# Patient Record
Sex: Female | Born: 1990 | Race: Black or African American | Hispanic: No | Smoking: Current every day smoker
Health system: Southern US, Community
[De-identification: ages and names within clinical notes are randomized; demographics above are authoritative.]

## PROBLEM LIST (undated history)

## (undated) ENCOUNTER — Inpatient Hospital Stay (HOSPITAL_COMMUNITY): Payer: Self-pay

## (undated) DIAGNOSIS — Z789 Other specified health status: Secondary | ICD-10-CM

## (undated) HISTORY — PX: TONSILLECTOMY AND ADENOIDECTOMY: SUR1326

## (undated) HISTORY — PX: VULVA SURGERY: SHX837

---

## 2015-11-24 ENCOUNTER — Ambulatory Visit (HOSPITAL_COMMUNITY)
Admission: EM | Admit: 2015-11-24 | Discharge: 2015-11-24 | Disposition: A | Discharge status: Home / Self Care | Payer: Medicaid Other | Attending: Physician Assistant | Admitting: Physician Assistant

## 2015-11-24 ENCOUNTER — Encounter (HOSPITAL_COMMUNITY): Payer: Self-pay | Admitting: Emergency Medicine

## 2015-11-24 DIAGNOSIS — R300 Dysuria: Secondary | ICD-10-CM | POA: Diagnosis not present

## 2015-11-24 DIAGNOSIS — R599 Enlarged lymph nodes, unspecified: Secondary | ICD-10-CM

## 2015-11-24 LAB — POCT URINALYSIS DIP (DEVICE)
Bilirubin Urine: NEGATIVE
GLUCOSE, UA: NEGATIVE mg/dL
KETONES UR: NEGATIVE mg/dL
Leukocytes, UA: NEGATIVE
Nitrite: NEGATIVE
PROTEIN: 30 mg/dL — AB
UROBILINOGEN UA: 0.2 mg/dL (ref 0.0–1.0)
pH: 6 (ref 5.0–8.0)

## 2015-11-24 MED ORDER — CEPHALEXIN 500 MG PO CAPS: 500.0000 mg | ORAL_CAPSULE | Freq: Four times a day (QID) | ORAL | 0 refills | Status: DC

## 2015-11-24 NOTE — ED Triage Notes (Signed)
The patient presented to the [facility] with a complaint of a possible swollen lymph node on the right side of neck. The patient also complained of dysuria and urinary frequency x 2 weeks.

## 2015-11-24 NOTE — ED Provider Notes (Signed)
CSN: [id number]     Arrival date & time 11/24/15  1619 History   First MD Initiated Contact with Patient 11/24/15 1653     Chief Complaint  Patient presents with  . Dysuria  . Lymphadenopathy   (Consider location/radiation/quality/duration/timing/severity/associated sxs/prior Treatment) HPI  25 year old female states that she has had dysuria for about 2 weeks. He denies any other symptoms in her vaginal area. She also complains of a single node right neck. There is been no treatment for either complaint. Patient states that she essentially works about 7 days a week and does not have time to come to urgent care. History reviewed. No pertinent past medical history. History reviewed. No pertinent surgical history. History reviewed. No pertinent family history. Social History  Substance Use Topics  . Smoking status: Heavy Tobacco Smoker    Packs/day: 0.50    Types: Cigarettes  . Smokeless tobacco: Never Used  . Alcohol use No   OB History    No data available     Review of Systems  Denies: HEADACHE, NAUSEA, ABDOMINAL PAIN, CHEST PAIN, CONGESTION, DYSURIA, SHORTNESS OF BREATH  Allergies  Review of patient's allergies indicates no known allergies.  Home Medications   Prior to Admission medications   Medication Sig Start Date End Date Taking? Authorizing Provider  cephALEXin (KEFLEX) 500 MG capsule Take 1 capsule (500 mg total) by mouth 4 (four) times daily. 11/24/15   Tharon Aquas, PA   Meds Ordered and Administered this Visit  Medications - No data to display  BP 130/76 (BP Location: Left Arm)   Pulse 70   Temp 98.3 F (36.8 C) (Oral)   Resp 16   LMP 11/24/2015 (Exact Date)   SpO2 100%  No data found.   Physical Exam NURSES NOTES AND VITAL SIGNS REVIEWED. CONSTITUTIONAL: Well developed, well nourished, no acute distress HEENT: normocephalic, atraumatic EYES: Conjunctiva normal NECK:normal ROM, supple, no adenopathy PULMONARY:No respiratory distress, normal  effort ABDOMINAL: Soft, ND, NT BS+, No CVAT MUSCULOSKELETAL: Normal ROM of all extremities,  SKIN: warm and dry without rash PSYCHIATRIC: Mood and affect, behavior are normal NURSES NOTES AND VITAL SIGNS REVIEWED. CONSTITUTIONAL: Well developed, well nourished, no acute distress HEENT: normocephalic, atraumatic, right and left TM's are normal EYES: Conjunctiva normal NECK:normal ROM, supple, no adenopathy PULMONARY:No respiratory distress, normal effort    Urgent Care Course   Clinical Course    Procedures (including critical care time)  Labs Review Labs Reviewed  POCT URINALYSIS DIP (DEVICE) - Abnormal; Notable for the following:       Result Value   Hgb urine dipstick LARGE (*)    Protein, ur 30 (*)    All other components within normal limits  Urine culture is pending at this time.  Imaging Review No results found.   Visual Acuity Review  Right Eye Distance:   Left Eye Distance:   Bilateral Distance:    Right Eye Near:   Left Eye Near:    Bilateral Near:      I have advised patient that her urinalysis is not positive for UTI so I would like to wait until urine culture returns. We'll treat left no swelling with Keflex and heat. Patient is advised to follow-up. Information for gynecology is provided to the patient.   Rx forKeflex MDM   1. Palpable lymph node   2. Dysuria     Patient is reassured that there are no issues that require transfer to higher level of care at this time or additional tests. Patient  is advised to continue home symptomatic treatment. Patient is advised that if there are new or worsening symptoms to attend the emergency department, contact primary care provider, or return to UC. Instructions of care provided discharged home in stable condition.    THIS NOTE WAS GENERATED USING A VOICE RECOGNITION SOFTWARE PROGRAM. ALL REASONABLE EFFORTS  WERE MADE TO PROOFREAD THIS DOCUMENT FOR ACCURACY.  I have verbally reviewed the discharge  instructions with the patient. A printed AVS was given to the patient.  All questions were answered prior to discharge.      Tharon AquasFrank C Deveon Kisiel, PA 11/24/15 2021    Tharon AquasFrank C Christyanna Mckeon, [place]PA 11/24/15 2023

## 2015-11-24 NOTE — Discharge Instructions (Signed)
Contact GYN for the issue of your prolonged period.

## 2015-11-26 ENCOUNTER — Telehealth (HOSPITAL_COMMUNITY): Payer: Self-pay | Admitting: Emergency Medicine

## 2015-11-26 NOTE — Telephone Encounter (Signed)
Patient called asking about pregnancy test result.  Verified identity.  Notified the only test run was urine dip for infection.  No further comment from patient

## 2015-12-07 ENCOUNTER — Encounter: Payer: Self-pay | Admitting: Obstetrics and Gynecology

## 2015-12-07 ENCOUNTER — Ambulatory Visit (INDEPENDENT_AMBULATORY_CARE_PROVIDER_SITE_OTHER): Payer: Medicaid Other | Admitting: Obstetrics and Gynecology

## 2015-12-07 DIAGNOSIS — N898 Other specified noninflammatory disorders of vagina: Secondary | ICD-10-CM | POA: Diagnosis not present

## 2015-12-07 DIAGNOSIS — Z3202 Encounter for pregnancy test, result negative: Secondary | ICD-10-CM | POA: Diagnosis not present

## 2015-12-07 DIAGNOSIS — O039 Complete or unspecified spontaneous abortion without complication: Secondary | ICD-10-CM

## 2015-12-07 DIAGNOSIS — Z202 Contact with and (suspected) exposure to infections with a predominantly sexual mode of transmission: Secondary | ICD-10-CM | POA: Diagnosis not present

## 2015-12-07 DIAGNOSIS — Z30011 Encounter for initial prescription of contraceptive pills: Secondary | ICD-10-CM

## 2015-12-07 LAB — POCT URINE PREGNANCY: Preg Test, Ur: NEGATIVE

## 2015-12-07 MED ORDER — DESOGESTREL-ETHINYL ESTRADIOL 0.15-30 MG-MCG PO TABS: 1.0000 | ORAL_TABLET | Freq: Every day | ORAL | 11 refills | Status: DC

## 2015-12-07 NOTE — Progress Notes (Signed)
Subjective:     Patient ID: Ralph DowdyKani Shutter, female   DOB: 07/13/1990, 25 y.o.   MRN: [id number]  HPI: Ms Perlie GoldRussell presents with several complaints. She has had irregular cycles since the birth of her last child. Has used several birth control options but has not liked for various reasons. Last being Nexplanon, had removed due to bleeding. She is sexual active but does not currently have a partner.  Had a faint postive Home UPT about a week ago. She had been bleeding for 3 weeks. Bleeding has now stopped but she has a tannish vaginal discharge. Denies any bowel or bladder dysfunction.    Review of Systems  Constitutional: Negative.   Respiratory: Negative.   Cardiovascular: Negative.   Gastrointestinal: Negative.   Genitourinary: Positive for vaginal discharge. Negative for menstrual problem, pelvic pain, urgency, vaginal bleeding and vaginal pain.       Objective:   Physical Exam  Constitutional: She appears well-developed and well-nourished.  Cardiovascular: Normal rate and regular rhythm.   Pulmonary/Chest: Effort normal and breath sounds normal.  Genitourinary:  Genitourinary Comments: Nl EGBUS, scant white/yellow discharge noted, uterus small mobile, non tender, no adnexal masses or tenderness       Assessment:    SAB   Vaginal discharge    Contraception management    Plan:     UPT in office today negative. Pt informed. Discussed contraceptive options. R/B of each reviewed. Following discussion, pt desires to try OCP's. Rx sent to pharmacy. To start today. Advised to use back up method. NuSwab obtained today for d/c and ST testing

## 2015-12-07 NOTE — Patient Instructions (Signed)
Oral Contraception Use Oral contraceptive pills (OCPs) are medicines taken to prevent pregnancy. OCPs work by preventing the ovaries from releasing eggs. The hormones in OCPs also cause the cervical mucus to thicken, preventing the sperm from entering the uterus. The hormones also cause the uterine lining to become thin, not allowing a fertilized egg to attach to the inside of the uterus. OCPs are highly effective when taken exactly as prescribed. However, OCPs do not prevent sexually transmitted diseases (STDs). Safe sex practices, such as using condoms along with an OCP, can help prevent STDs. Before taking OCPs, you may have a physical exam and Pap test. Your health care provider may also order blood tests if necessary. Your health care provider will make sure you are a good candidate for oral contraception. Discuss with your health care provider the possible side effects of the OCP you may be prescribed. When starting an OCP, it can take 2 to 3 months for the body to adjust to the changes in hormone levels in your body.  HOW TO TAKE ORAL CONTRACEPTIVE PILLS Your health care provider may advise you on how to start taking the first cycle of OCPs. Otherwise, you can:   Start on day 1 of your menstrual period. You will not need any backup contraceptive protection with this start time.   Start on the first Sunday after your menstrual period or the day you get your prescription. In these cases, you will need to use backup contraceptive protection for the first week.   Start the pill at any time of your cycle. If you take the pill within 5 days of the start of your period, you are protected against pregnancy right away. In this case, you will not need a backup form of birth control. If you start at any other time of your menstrual cycle, you will need to use another form of birth control for 7 days. If your OCP is the type called a minipill, it will protect you from pregnancy after taking it for 2 days (48  hours). After you have started taking OCPs:   If you forget to take 1 pill, take it as soon as you remember. Take the next pill at the regular time.   If you miss 2 or more pills, call your health care provider because different pills have different instructions for missed doses. Use backup birth control until your next menstrual period starts.   If you use a 28-day pack that contains inactive pills and you miss 1 of the last 7 pills (pills with no hormones), it will not matter. Throw away the rest of the non-hormone pills and start a new pill pack.  No matter which day you start the OCP, you will always start a new pack on that same day of the week. Have an extra pack of OCPs and a backup contraceptive method available in case you miss some pills or lose your OCP pack.  HOME CARE INSTRUCTIONS   Do not smoke.   Always use a condom to protect against STDs. OCPs do not protect against STDs.   Use a calendar to mark your menstrual period days.   Read the information and directions that came with your OCP. Talk to your health care provider if you have questions.  SEEK MEDICAL CARE IF:   You develop nausea and vomiting.   You have abnormal vaginal discharge or bleeding.   You develop a rash.   You miss your menstrual period.   You are losing   your hair.   You need treatment for mood swings or depression.   You get dizzy when taking the OCP.   You develop acne from taking the OCP.   You become pregnant.  SEEK IMMEDIATE MEDICAL CARE IF:   You develop chest pain.   You develop shortness of breath.   You have an uncontrolled or severe headache.   You develop numbness or slurred speech.   You develop visual problems.   You develop pain, redness, and swelling in the legs.    This information is not intended to replace advice given to you by your health care provider. Make sure you discuss any questions you have with your health care provider.   Document  Released: 03/24/2011 Document Revised: 04/25/2014 Document Reviewed: 09/23/2012 Elsevier Interactive Patient Education 2016 Elsevier Inc.  

## 2015-12-10 ENCOUNTER — Telehealth: Payer: Self-pay | Admitting: *Deleted

## 2015-12-10 NOTE — Telephone Encounter (Signed)
Patient made aware of lab results and has a c/o vaginal discharge with itching and a foul odor Flagyl 500 mo po twice a day x 7 days and Diflucan 150 mg po x 1 [facility] [phone].

## 2015-12-14 ENCOUNTER — Telehealth: Payer: Self-pay | Admitting: *Deleted

## 2015-12-14 ENCOUNTER — Encounter: Payer: Self-pay | Admitting: *Deleted

## 2015-12-14 DIAGNOSIS — A749 Chlamydial infection, unspecified: Secondary | ICD-10-CM

## 2015-12-14 DIAGNOSIS — N760 Acute vaginitis: Principal | ICD-10-CM

## 2015-12-14 DIAGNOSIS — B9689 Other specified bacterial agents as the cause of diseases classified elsewhere: Secondary | ICD-10-CM

## 2015-12-14 LAB — NUSWAB VG+, CANDIDA 6SP
Atopobium vaginae: HIGH Score — AB
BVAB 2: HIGH Score — AB
CANDIDA PARAPSILOSIS, NAA: NEGATIVE
CANDIDA TROPICALIS, NAA: NEGATIVE
Candida albicans, NAA: NEGATIVE
Candida glabrata, NAA: NEGATIVE
Candida krusei, NAA: NEGATIVE
Candida lusitaniae, NAA: NEGATIVE
Chlamydia trachomatis, NAA: POSITIVE — AB
MEGASPHAERA 1: HIGH {score} — AB
Neisseria gonorrhoeae, NAA: NEGATIVE
TRICH VAG BY NAA: NEGATIVE

## 2015-12-14 MED ORDER — METRONIDAZOLE 0.75 % VA GEL: 1.0000 | Freq: Every day | VAGINAL | 0 refills | Status: AC

## 2015-12-14 MED ORDER — AZITHROMYCIN 250 MG PO TABS
250.0000 mg | ORAL_TABLET | Freq: Once | ORAL | 0 refills | Status: AC
Start: 2015-12-14 — End: 2015-12-14

## 2015-12-14 NOTE — Telephone Encounter (Signed)
Per Dr Alysia PennaErvin, I ordered 1 gm azithromycin for her +chlamydia, and Metrogel one applicator qhs x7 days, sent to pharmacy of record. Attempted to call patient with test results and prescription info. The call went to voice mail but was unable to leave a message as the mailbox was full. Will send letter and STD card.

## 2015-12-14 NOTE — Telephone Encounter (Signed)
-----   Message from Hermina StaggersMichael L Ervin, MD sent at 12/14/2015  8:24 AM EDT ----- Please call pt and let her know about + Chlamydia. Treat with 1 gm of Azithromycin. Advise to have partner tested and treated as well Reframe from IC until Tx competed. Also Metrogel 1 applicator qhs x 7 days Thanks  Casimiro NeedleMichael ----- Message ----- From: Arne ClevelandMandy J Hutchinson, CMA Sent: 12/07/2015   9:24 AM To: Hermina StaggersMichael L Ervin, MD

## 2016-05-27 ENCOUNTER — Emergency Department (HOSPITAL_COMMUNITY)
Admission: EM | Admit: 2016-05-27 | Discharge: 2016-05-27 | Disposition: A | Discharge status: Home / Self Care | Payer: Medicaid Other | Attending: Emergency Medicine | Admitting: Emergency Medicine

## 2016-05-27 ENCOUNTER — Encounter (HOSPITAL_COMMUNITY): Payer: Self-pay | Admitting: *Deleted

## 2016-05-27 DIAGNOSIS — R0981 Nasal congestion: Secondary | ICD-10-CM | POA: Diagnosis present

## 2016-05-27 DIAGNOSIS — F17210 Nicotine dependence, cigarettes, uncomplicated: Secondary | ICD-10-CM | POA: Insufficient documentation

## 2016-05-27 DIAGNOSIS — J0100 Acute maxillary sinusitis, unspecified: Secondary | ICD-10-CM | POA: Insufficient documentation

## 2016-05-27 LAB — PREGNANCY, URINE: PREG TEST UR: NEGATIVE

## 2016-05-27 MED ORDER — GUAIFENESIN 100 MG/5ML PO LIQD: 100.0000 mg | ORAL | 0 refills | Status: DC | PRN

## 2016-05-27 MED ORDER — FLUCONAZOLE 150 MG PO TABS: 150.0000 mg | ORAL_TABLET | Freq: Once | ORAL | 0 refills | Status: AC

## 2016-05-27 MED ORDER — AMOXICILLIN-POT CLAVULANATE 875-125 MG PO TABS: 1.0000 | ORAL_TABLET | Freq: Two times a day (BID) | ORAL | 0 refills | Status: DC

## 2016-05-27 NOTE — ED Provider Notes (Signed)
[facility] Provider Note   CSN: [id number] Arrival date & time: 05/27/16  1100  By signing my name below, I, Teofilo PodMatthew P. Jamison, attest that this documentation has been prepared under the direction and in the presence of Fayrene HelperBowie Keirstan Iannello, PA-C. Electronically Signed: Teofilo PodMatthew P. Jamison, ED Scribe. 05/27/2016. 2:20 PM.    History   Chief Complaint Chief Complaint  Patient presents with  . Nasal Congestion  . Cough   The history is provided by the patient. No language interpreter was used.   HPI Comments:  Jenna Mcgrath is a 26 y.o. female who presents to the Emergency Department complaining of persistent productive cough x 2 weeks. Pt reports yellow and green sputum with her cough. Pt complains of associated rhinorrhea (green and yellow), congestion, generalized body aches, enlarged lymph nodes on the right side, neck pain. No known allergy to medication, but notes that she typically gets yeast infections with antibiotics. Pt reports possible pregnancy, LNMP was 2 weeks ago. Pt has taken Theraflu, Nyquil. Pt denies sore throat, nausea, vomiting, diarrhea, SOB  History reviewed. No pertinent past medical history.  Patient Active Problem List   Diagnosis Date Noted  . Spontaneous abortion 12/07/2015  . Vaginal discharge 12/07/2015  . Encounter for initial prescription of contraceptive pills 12/07/2015  . Exposure to STD 12/07/2015    Past Surgical History:  Procedure Laterality Date  . TONSILLECTOMY AND ADENOIDECTOMY    . VULVA SURGERY      OB History    Gravida Para Term Preterm AB Living   3 1 1   2 1    SAB TAB Ectopic Multiple Live Births   1 1     1        Home Medications    Prior to Admission medications   Medication Sig Start Date End Date Taking? Authorizing Provider  desogestrel-ethinyl estradiol (APRI) 0.15-30 MG-MCG tablet Take 1 tablet by mouth daily. 12/07/15   Hermina StaggersMichael L Ervin, MD    Family History Family History  Problem Relation Age of Onset  .  Cancer Maternal Grandmother   . Diabetes Maternal Grandmother   . Diabetes Paternal Grandmother   . Colon cancer Paternal Grandfather     Social History Social History  Substance Use Topics  . Smoking status: Heavy Tobacco Smoker    Packs/day: 0.50    Types: Cigarettes  . Smokeless tobacco: Never Used  . Alcohol use No     Comment: Socially     Allergies   Patient has no known allergies.   Review of Systems Review of Systems  HENT: Positive for congestion and rhinorrhea. Negative for sore throat.   Respiratory: Positive for cough. Negative for shortness of breath.   Gastrointestinal: Negative for diarrhea, nausea and vomiting.  Musculoskeletal: Positive for myalgias and neck pain.     Physical Exam Updated Vital Signs BP 137/87 (BP Location: Right Arm)   Pulse 95   Temp 98.4 F (36.9 C) (Oral)   Resp 16   LMP 05/13/2016   SpO2 100%   Physical Exam  Constitutional: She appears well-developed and well-nourished. No distress.  HENT:  Head: Normocephalic and atraumatic.  Right Ear: External ear normal.  Left Ear: External ear normal.  Nose: Nose normal.  Mouth/Throat: Oropharynx is clear and moist.  Tenderness to frontal and maxillary sinus on percussion. No nuchal rigidity.   Eyes: Conjunctivae are normal.  Cardiovascular: Normal rate and normal heart sounds.  Exam reveals no gallop and no friction rub.   No murmur heard. Mild  tachycardia  Pulmonary/Chest: Effort normal and breath sounds normal. She has no rales.  Abdominal: She exhibits no distension.  Neurological: She is alert.  Skin: Skin is warm and dry.  Psychiatric: She has a normal mood and affect.  Nursing note and vitals reviewed.    ED Treatments / Results  DIAGNOSTIC STUDIES:  Oxygen Saturation is 100% on RA, normal by my interpretation.    COORDINATION OF CARE:  2:20 PM Will order antibiotics. Discussed treatment plan with pt at bedside and pt agreed to plan.   Labs (all labs ordered  are listed, but only abnormal results are displayed) Labs Reviewed - No data to display  EKG  EKG Interpretation None       Radiology No results found.  Procedures Procedures (including critical care time)  Medications Ordered in ED Medications - No data to display   Initial Impression / Assessment and Plan / ED Course  Pt symptoms suggestive of sinusitis (persistent greenish nasal discharge >10 days) Pt will be discharged with augmentin.  She also request medication for potential yeast infection from abx use.  Her pregnancy test is negative, diflucan prescribed.  Discussed return precautions.  Pt is hemodynamically stable & in NAD prior to discharge.  I have reviewed the triage vital signs and the nursing notes.  Pertinent labs & imaging results that were available during my care of the patient were reviewed by me and considered in my medical decision making (see chart for details).     BP 137/87 (BP Location: Right Arm)   Pulse 95   Temp 98.4 F (36.9 C) (Oral)   Resp 16   LMP 05/13/2016   SpO2 100%    Final Clinical Impressions(s) / ED Diagnoses   Final diagnoses:  Acute maxillary sinusitis, recurrence not specified    New Prescriptions New Prescriptions   AMOXICILLIN-CLAVULANATE (AUGMENTIN) 875-125 MG TABLET    Take 1 tablet by mouth 2 (two) times daily. One po bid x 7 days   FLUCONAZOLE (DIFLUCAN) 150 MG TABLET    Take 1 tablet (150 mg total) by mouth once.   GUAIFENESIN (ROBITUSSIN) 100 MG/5ML LIQUID    Take 5-10 mLs (100-200 mg total) by mouth every 4 (four) hours as needed for cough.   I personally performed the services described in this documentation, which was scribed in my presence. The recorded information has been reviewed and is accurate.       Fayrene Helper, PA-C 05/27/16 1558    Raeford Razor, MD 06/06/16 [phone]

## 2016-05-27 NOTE — ED Triage Notes (Signed)
Pt c/o productive cough & nasal drainage with green & brown nasal drainage & sputum, pt generalized body aches, onset x 2 wks, pt denies n/v/d, A&O x4

## 2016-05-27 NOTE — ED Notes (Signed)
Pt states she understands instructions. Home stable with steady gait and family. 

## 2016-05-27 NOTE — ED Notes (Signed)
Pt oob to BR with steady gait. 

## 2016-05-27 NOTE — ED Notes (Signed)
Pt c/o yellow-green nasal discharge.

## 2016-07-05 ENCOUNTER — Telehealth: Payer: Self-pay | Admitting: *Deleted

## 2016-07-05 DIAGNOSIS — B379 Candidiasis, unspecified: Secondary | ICD-10-CM

## 2016-07-05 DIAGNOSIS — T3695XA Adverse effect of unspecified systemic antibiotic, initial encounter: Secondary | ICD-10-CM

## 2016-07-05 DIAGNOSIS — N3000 Acute cystitis without hematuria: Secondary | ICD-10-CM

## 2016-07-05 MED ORDER — FLUCONAZOLE 150 MG PO TABS
150.0000 mg | ORAL_TABLET | Freq: Once | ORAL | 0 refills | Status: AC
Start: 2016-07-05 — End: 2016-07-05

## 2016-07-05 MED ORDER — SULFAMETHOXAZOLE-TRIMETHOPRIM 800-160 MG PO TABS: 1.0000 | ORAL_TABLET | Freq: Two times a day (BID) | ORAL | 0 refills | Status: DC

## 2016-07-05 MED ORDER — PHENAZOPYRIDINE HCL 200 MG PO TABS: 200.0000 mg | ORAL_TABLET | Freq: Three times a day (TID) | ORAL | 0 refills | Status: AC | PRN

## 2016-07-05 NOTE — Telephone Encounter (Signed)
Patient is calling with a bladder infection- she is having burning with urination. Protocol medication sent to her pharmacy. Patient request yeast treatment for after- Diflucan sent for that. Patient instructed to call for appointment if treatment does not work.

## 2016-07-26 ENCOUNTER — Other Ambulatory Visit: Payer: Self-pay | Admitting: *Deleted

## 2016-07-26 DIAGNOSIS — B9689 Other specified bacterial agents as the cause of diseases classified elsewhere: Secondary | ICD-10-CM

## 2016-07-26 DIAGNOSIS — N760 Acute vaginitis: Principal | ICD-10-CM

## 2016-07-26 MED ORDER — METRONIDAZOLE 500 MG PO TABS: 500.0000 mg | ORAL_TABLET | Freq: Two times a day (BID) | ORAL | 0 refills | Status: DC

## 2016-07-26 NOTE — Progress Notes (Signed)
Pt called to office with symptoms of BV after her cycle. Pt request treatment. Flagyl sent per protocol.  Pt advised to contact office if no change after treatment.

## 2016-07-27 ENCOUNTER — Other Ambulatory Visit (HOSPITAL_COMMUNITY)
Admission: RE | Admit: 2016-07-27 | Discharge: 2016-07-27 | Disposition: A | Discharge status: Home / Self Care | Payer: Medicaid Other | Source: Ambulatory Visit | Attending: Obstetrics and Gynecology | Admitting: Obstetrics and Gynecology

## 2016-07-27 ENCOUNTER — Ambulatory Visit (INDEPENDENT_AMBULATORY_CARE_PROVIDER_SITE_OTHER): Payer: Medicaid Other | Admitting: Obstetrics and Gynecology

## 2016-07-27 ENCOUNTER — Encounter: Payer: Self-pay | Admitting: Obstetrics and Gynecology

## 2016-07-27 VITALS — BP 128/83 | HR 80 | Ht 67.0 in | Wt 193.7 lb

## 2016-07-27 DIAGNOSIS — N760 Acute vaginitis: Secondary | ICD-10-CM | POA: Insufficient documentation

## 2016-07-27 DIAGNOSIS — Z113 Encounter for screening for infections with a predominantly sexual mode of transmission: Secondary | ICD-10-CM

## 2016-07-27 NOTE — Progress Notes (Signed)
26 yo [id number] presenting today for the evaluation of a non pruritic vaginal discharge with odor. Patient reports the presence of this discharge for the past 2 days. She has been sexually active with a new partner without contraception. She admits to not taking her prescribed OCP because she is forgetful. She is researching different options. Patient denies pelvic pain. Patient was treated in August for chlamydia  No past medical history on file. Past Surgical History:  Procedure Laterality Date  . TONSILLECTOMY AND ADENOIDECTOMY    . VULVA SURGERY     Family History  Problem Relation Age of Onset  . Cancer Maternal Grandmother   . Diabetes Maternal Grandmother   . Diabetes Paternal Grandmother   . Colon cancer Paternal Grandfather    Social History  Substance Use Topics  . Smoking status: Heavy Tobacco Smoker    Packs/day: 0.50    Types: Cigarettes  . Smokeless tobacco: Never Used  . Alcohol use No     Comment: Socially   ROS See pertinent in HPI  GENERAL: Well-developed, well-nourished female in no acute distress.  ABDOMEN: Soft, nontender, nondistended.  PELVIC: Normal external female genitalia. Vagina is pink and rugated.  Normal discharge. Normal appearing cervix. Uterus is normal in size. No adnexal mass or tenderness. EXTREMITIES: No cyanosis, clubbing, or edema, 2+ distal pulses.  A/P 26 yo with vaginitis - NuSwab collected - STD screen also collected - Patient will be contacted with abnormal results - RTC prn for annual exam

## 2016-07-27 NOTE — Progress Notes (Signed)
Pt states that she has been having a vaginal odor and wants to get STD tested with blood work included.

## 2016-07-28 LAB — HEPATITIS B SURFACE ANTIGEN: HEP B S AG: NEGATIVE

## 2016-07-28 LAB — CERVICOVAGINAL ANCILLARY ONLY
BACTERIAL VAGINITIS: POSITIVE — AB
Candida vaginitis: NEGATIVE
Chlamydia: NEGATIVE
Neisseria Gonorrhea: NEGATIVE
Trichomonas: NEGATIVE

## 2016-07-28 LAB — RPR: RPR Ser Ql: NONREACTIVE

## 2016-07-28 LAB — HEPATITIS C ANTIBODY

## 2016-07-28 LAB — HIV ANTIBODY (ROUTINE TESTING W REFLEX): HIV Screen 4th Generation wRfx: NONREACTIVE

## 2016-07-29 ENCOUNTER — Other Ambulatory Visit: Payer: Self-pay | Admitting: Obstetrics and Gynecology

## 2016-07-29 ENCOUNTER — Telehealth: Payer: Self-pay | Admitting: *Deleted

## 2016-07-29 MED ORDER — METRONIDAZOLE 500 MG PO TABS: 500.0000 mg | ORAL_TABLET | Freq: Two times a day (BID) | ORAL | 0 refills | Status: DC

## 2016-07-29 NOTE — Telephone Encounter (Signed)
Pt called to office for lab results. Pt made aware of results and Rx at pharmacy

## 2016-08-11 ENCOUNTER — Ambulatory Visit: Payer: Medicaid Other | Admitting: Obstetrics and Gynecology

## 2016-09-05 ENCOUNTER — Other Ambulatory Visit: Payer: Self-pay

## 2016-09-05 ENCOUNTER — Telehealth: Payer: Self-pay

## 2016-09-05 DIAGNOSIS — N3000 Acute cystitis without hematuria: Secondary | ICD-10-CM

## 2016-09-05 MED ORDER — SULFAMETHOXAZOLE-TRIMETHOPRIM 800-160 MG PO TABS: 1.0000 | ORAL_TABLET | Freq: Two times a day (BID) | ORAL | 0 refills | Status: DC

## 2016-09-05 MED ORDER — SULFAMETHOXAZOLE-TRIMETHOPRIM 800-160 MG PO TABS: 1.0000 | ORAL_TABLET | Freq: Two times a day (BID) | ORAL | Status: DC

## 2016-09-05 NOTE — Telephone Encounter (Signed)
Pt complains of having an odor with urine and urinary urgency. She denies any pain. Sx started last Friday. Per protocol Septra DS sent to pharmacy. Pt advised to follow up at office if sx does not improve.  Pt is also requesting diflucan for after atb tx. Told pt that I would have to check with dr for this rx

## 2016-09-06 NOTE — Telephone Encounter (Signed)
Please provide diflucan request  Thanks  Gigi Gineggy

## 2016-09-07 ENCOUNTER — Other Ambulatory Visit: Payer: Self-pay

## 2016-09-07 DIAGNOSIS — T3695XA Adverse effect of unspecified systemic antibiotic, initial encounter: Principal | ICD-10-CM

## 2016-09-07 DIAGNOSIS — B379 Candidiasis, unspecified: Secondary | ICD-10-CM

## 2016-09-07 MED ORDER — FLUCONAZOLE 150 MG PO TABS: 150.0000 mg | ORAL_TABLET | Freq: Once | ORAL | 0 refills | Status: AC

## 2016-09-07 NOTE — Progress Notes (Unsigned)
diflucan 

## 2016-09-20 ENCOUNTER — Telehealth: Payer: Self-pay | Admitting: *Deleted

## 2016-09-20 ENCOUNTER — Other Ambulatory Visit: Payer: Medicaid Other

## 2016-09-20 DIAGNOSIS — N3000 Acute cystitis without hematuria: Secondary | ICD-10-CM

## 2016-09-20 MED ORDER — NITROFURANTOIN MONOHYD MACRO 100 MG PO CAPS: 100.0000 mg | ORAL_CAPSULE | Freq: Two times a day (BID) | ORAL | 0 refills | Status: DC

## 2016-09-20 NOTE — Progress Notes (Signed)
Patient presents for Urine Dip Stick, She did not take Septra correctly, she missed one day but took it the next day. Denies burning, urgency, pain or odor.  Dip Stick  +++Nitrates

## 2016-09-20 NOTE — Telephone Encounter (Signed)
Pt called to office stating she was treated for UTI. Pt states she did not take medication correctly. Pt states her urine is still very cloudy and has bad odor.  Pt advised she will need to come in for urine dip and culture to be sent to lab. Pt made aware that we will need results before any additional medication to be given.  Pt states understanding and has been scheduled.

## 2016-10-12 ENCOUNTER — Ambulatory Visit (HOSPITAL_COMMUNITY)
Admission: EM | Admit: 2016-10-12 | Discharge: 2016-10-12 | Disposition: A | Discharge status: Home / Self Care | Payer: Medicaid Other | Attending: Family Medicine | Admitting: Family Medicine

## 2016-10-12 ENCOUNTER — Encounter (HOSPITAL_COMMUNITY): Payer: Self-pay | Admitting: Emergency Medicine

## 2016-10-12 DIAGNOSIS — N912 Amenorrhea, unspecified: Secondary | ICD-10-CM

## 2016-10-12 DIAGNOSIS — Z3201 Encounter for pregnancy test, result positive: Secondary | ICD-10-CM | POA: Diagnosis not present

## 2016-10-12 DIAGNOSIS — Z3490 Encounter for supervision of normal pregnancy, unspecified, unspecified trimester: Secondary | ICD-10-CM

## 2016-10-12 LAB — POCT PREGNANCY, URINE: Preg Test, Ur: POSITIVE — AB

## 2016-10-12 MED ORDER — PRENATAL COMPLETE 14-0.4 MG PO TABS: 1.0000 | ORAL_TABLET | Freq: Every day | ORAL | 0 refills | Status: AC

## 2016-10-12 NOTE — Discharge Instructions (Signed)
Please schedule a follow up with your OB doctor.

## 2016-10-12 NOTE — ED Provider Notes (Signed)
  [facility]   [id number] 10/12/16 Arrival Time: 1030  ASSESSMENT & PLAN:  1. Amenorrhea   2. Pregnancy, unspecified gestational age    Labs Reviewed  POCT PREGNANCY, URINE - Abnormal; Notable for the following:       Result Value   Preg Test, Ur POSITIVE (*)    All other components within normal limits    Meds ordered this encounter  Medications  . Prenatal Vit-Fe Fumarate-FA (PRENATAL COMPLETE) 14-0.4 MG TABS    Sig: Take 1 tablet by mouth daily.    Dispense:  30 each    Refill:  0   She is very pleased to be pregnant. Will schedule f/u with her OB doctor. May f/u here as needed. After Visit Summary given.   SUBJECTIVE:  Jenna Mcgrath is a 26 y.o. female who requests testing for pregnancy. Two + tests at home. One previous pregnancy without complications. She is otherwise well.  ROS: As per HPI.   OBJECTIVE:  Vitals:   10/12/16 1049  BP: 128/81  Pulse: 78  Temp: 98.5 F (36.9 C)  TempSrc: Oral  SpO2: 100%     General appearance: alert, cooperative, appears stated age and no distress Abdomen: normal Extremities: extremities normal, atraumatic, no cyanosis or edema Psychological:  Alert and cooperative. Normal mood and affect  No Known Allergies  PMHx, SurgHx, SocialHx, Medications, and Allergies were reviewed in the Visit Navigator and updated as appropriate.       Mardella LaymanHagler, [name], MD 10/12/16 [phone]

## 2016-10-12 NOTE — ED Triage Notes (Signed)
Pt here for a pregnancy test.  Pt had two positive results at home.

## 2016-10-30 ENCOUNTER — Inpatient Hospital Stay (HOSPITAL_COMMUNITY)
Admission: AD | Admit: 2016-10-30 | Discharge: 2016-10-30 | Disposition: A | Discharge status: Home / Self Care | Payer: Medicaid Other | Source: Ambulatory Visit | Attending: Obstetrics & Gynecology | Admitting: Obstetrics & Gynecology

## 2016-10-30 ENCOUNTER — Encounter (HOSPITAL_COMMUNITY): Payer: Self-pay | Admitting: *Deleted

## 2016-10-30 DIAGNOSIS — O219 Vomiting of pregnancy, unspecified: Secondary | ICD-10-CM | POA: Diagnosis not present

## 2016-10-30 DIAGNOSIS — O210 Mild hyperemesis gravidarum: Secondary | ICD-10-CM | POA: Diagnosis present

## 2016-10-30 DIAGNOSIS — Z3A01 Less than 8 weeks gestation of pregnancy: Secondary | ICD-10-CM | POA: Insufficient documentation

## 2016-10-30 DIAGNOSIS — Z87891 Personal history of nicotine dependence: Secondary | ICD-10-CM | POA: Insufficient documentation

## 2016-10-30 LAB — URINALYSIS, ROUTINE W REFLEX MICROSCOPIC
Bilirubin Urine: NEGATIVE
Glucose, UA: NEGATIVE mg/dL
Hgb urine dipstick: NEGATIVE
KETONES UR: NEGATIVE mg/dL
LEUKOCYTES UA: NEGATIVE
NITRITE: NEGATIVE
PH: 7 (ref 5.0–8.0)
PROTEIN: NEGATIVE mg/dL
Specific Gravity, Urine: 1.019 (ref 1.005–1.030)

## 2016-10-30 MED ORDER — PROMETHAZINE HCL 25 MG/ML IJ SOLN
25.0000 mg | Freq: Once | INTRAMUSCULAR | Status: AC
  Administered 2016-10-30: 25 mg via INTRAMUSCULAR
  Filled 2016-10-30: qty 1

## 2016-10-30 MED ORDER — PROMETHAZINE HCL 25 MG RE SUPP: 25.0000 mg | Freq: Four times a day (QID) | RECTAL | 0 refills | Status: DC | PRN

## 2016-10-30 MED ORDER — PROMETHAZINE HCL 25 MG PO TABS: 12.5000 mg | ORAL_TABLET | Freq: Four times a day (QID) | ORAL | 2 refills | Status: DC | PRN

## 2016-10-30 NOTE — MAU Note (Signed)
Ongoing vomiting last 2 wks, getting worse.  [facility] No pain.

## 2016-10-30 NOTE — MAU Note (Signed)
+  n/v States unable to keep anything down Past 24 hours emesis x 4-5 times

## 2016-10-30 NOTE — MAU Provider Note (Signed)
Chief Complaint: Emesis   None     SUBJECTIVE HPI: Jenna Mcgrath is a 26 y.o. G4P1021 at [redacted]w[redacted]d by LMP who presents to maternity admissions reporting nausea and vomiting with emesis x 5 in 24 hours.  She reports vomiting every time she tries to eat or drink something.  She has not tried any treatments other than trying to eat small amounts of bland foods and drink ginger ale.  These are not working.    There are no associated symptoms, she denies pain or vaginal bleeding. She denies vaginal itching/burning, urinary symptoms, h/a, dizziness, or fever/chills.     HPI  History reviewed. No pertinent past medical history. Past Surgical History:  Procedure Laterality Date  . TONSILLECTOMY AND ADENOIDECTOMY    . VULVA SURGERY     Social History   Social History  . Marital status: Single    Spouse name: N/A  . Number of children: N/A  . Years of education: N/A   Occupational History  . Not on file.   Social History Main Topics  . Smoking status: Former Smoker    Packs/day: 0.50    Types: Cigarettes    Quit date: 10/07/2016  . Smokeless tobacco: Never Used  . Alcohol use No     Comment: Socially  . Drug use: No  . Sexual activity: Yes    Birth control/ protection: None   Other Topics Concern  . Not on file   Social History Narrative  . No narrative on file   No current facility-administered medications on file prior to encounter.    No current outpatient prescriptions on file prior to encounter.   No Known Allergies  ROS:  Review of Systems  Constitutional: Negative for chills, fatigue and fever.  Respiratory: Negative for shortness of breath.   Cardiovascular: Negative for chest pain.  Gastrointestinal: Positive for nausea and vomiting.  Genitourinary: Negative for difficulty urinating, dysuria, flank pain, pelvic pain, vaginal bleeding, vaginal discharge and vaginal pain.  Neurological: Negative for dizziness and headaches.  Psychiatric/Behavioral: Negative.       I have reviewed patient's Past Medical Hx, Surgical Hx, Family Hx, Social Hx, medications and allergies.   Physical Exam   Patient Vitals for the past 24 hrs:  BP Temp Temp src Pulse Resp SpO2 Weight  10/30/16 1555 122/62 - - 78 16 100 % -  10/30/16 1430 134/69 98.3 F (36.8 C) Oral 80 16 100 % 199 lb 8 oz (90.5 kg)   Constitutional: Well-developed, well-nourished female in no acute distress.  Cardiovascular: normal rate Respiratory: normal effort GI: Abd soft, non-tender. Pos BS x 4 MS: Extremities nontender, no edema, normal ROM Neurologic: Alert and oriented x 4.  GU: Neg CVAT.   LAB RESULTS Results for orders placed or performed during the hospital encounter of 10/30/16 (from the past 24 hour(s))  Urinalysis, Routine w reflex microscopic     Status: Abnormal   Collection Time: 10/30/16  2:25 PM  Result Value Ref Range   Color, Urine YELLOW YELLOW   APPearance HAZY (A) CLEAR   Specific Gravity, Urine 1.019 1.005 - 1.030   pH 7.0 5.0 - 8.0   Glucose, UA NEGATIVE NEGATIVE mg/dL   Hgb urine dipstick NEGATIVE NEGATIVE   Bilirubin Urine NEGATIVE NEGATIVE   Ketones, ur NEGATIVE NEGATIVE mg/dL   Protein, ur NEGATIVE NEGATIVE mg/dL   Nitrite NEGATIVE NEGATIVE   Leukocytes, UA NEGATIVE NEGATIVE       IMAGING No results found.  MAU Management/MDM: Ordered labs and  reviewed results.  Urine without evidence of dehydration.  Phenergan 25 mg IM x 1 dose in MAU. Rx for Phenergan 12.5-25 mg PO Q 6 hours PRN. Sip fluids, try sports drinks, continue frequent small bland meals. Pt to f/u as scheduled with [facility].  Pt stable at time of discharge.  ASSESSMENT 1. Nausea and vomiting during pregnancy prior to [redacted] weeks gestation     PLAN Discharge home Allergies as of 10/30/2016   No Known Allergies     Medication List    STOP taking these medications   amoxicillin-clavulanate 875-125 MG tablet Commonly known as:  AUGMENTIN   guaiFENesin 100 MG/5ML liquid Commonly  known as:  ROBITUSSIN     TAKE these medications   promethazine 25 MG tablet Commonly known as:  PHENERGAN Take 0.5-1 tablets (12.5-25 mg total) by mouth every 6 (six) hours as needed for nausea.   promethazine 25 MG suppository Commonly known as:  PHENERGAN Place 1 suppository (25 mg total) rectally every 6 (six) hours as needed for nausea or vomiting.      Follow-up Information    [facility].   Why:  As scheduled, return to MAU as needed for emergencies Contact information: [address] [place] [phone]          Sharen CounterLisa Leftwich-Kirby Certified Nurse-Midwife 10/30/2016  6:45 PM

## 2016-11-16 ENCOUNTER — Encounter: Payer: Medicaid Other | Admitting: Obstetrics & Gynecology

## 2016-11-28 ENCOUNTER — Encounter: Payer: Self-pay | Admitting: Certified Nurse Midwife

## 2016-11-28 ENCOUNTER — Other Ambulatory Visit (HOSPITAL_COMMUNITY)
Admission: RE | Admit: 2016-11-28 | Discharge: 2016-11-28 | Disposition: A | Discharge status: Home / Self Care | Payer: Medicaid Other | Source: Ambulatory Visit | Attending: Certified Nurse Midwife | Admitting: Certified Nurse Midwife

## 2016-11-28 ENCOUNTER — Other Ambulatory Visit: Payer: Self-pay | Admitting: *Deleted

## 2016-11-28 ENCOUNTER — Ambulatory Visit (INDEPENDENT_AMBULATORY_CARE_PROVIDER_SITE_OTHER): Payer: Medicaid Other | Admitting: Certified Nurse Midwife

## 2016-11-28 VITALS — BP 124/75 | HR 88 | Wt 203.0 lb

## 2016-11-28 DIAGNOSIS — Z3687 Encounter for antenatal screening for uncertain dates: Secondary | ICD-10-CM | POA: Diagnosis not present

## 2016-11-28 DIAGNOSIS — Z3480 Encounter for supervision of other normal pregnancy, unspecified trimester: Secondary | ICD-10-CM | POA: Insufficient documentation

## 2016-11-28 DIAGNOSIS — O219 Vomiting of pregnancy, unspecified: Secondary | ICD-10-CM

## 2016-11-28 DIAGNOSIS — Z3481 Encounter for supervision of other normal pregnancy, first trimester: Secondary | ICD-10-CM

## 2016-11-28 LAB — OB RESULTS CONSOLE GBS: GBS: POSITIVE

## 2016-11-28 MED ORDER — DOXYLAMINE-PYRIDOXINE ER 20-20 MG PO TBCR: 1.0000 | EXTENDED_RELEASE_TABLET | Freq: Two times a day (BID) | ORAL | 6 refills | Status: DC

## 2016-11-28 MED ORDER — PRENATE PIXIE 10-0.6-0.4-200 MG PO CAPS: 1.0000 | ORAL_CAPSULE | Freq: Every day | ORAL | 12 refills | Status: DC

## 2016-11-28 MED ORDER — DOXYLAMINE-PYRIDOXINE 10-10 MG PO TBEC: 1.0000 | DELAYED_RELEASE_TABLET | Freq: Three times a day (TID) | ORAL | 99 refills | Status: DC

## 2016-11-28 NOTE — Progress Notes (Unsigned)
Bonjesta not covered- Diclegis sent 

## 2016-11-28 NOTE — Progress Notes (Signed)
Pt unsure of dating.  Pt states LMP was last cycle was lighter than normal. Pt states no relief from nausea with use of Phenergan. Pt states she has swollen lymph node and has appt later this week to have eval. Pt also has dental appt on Wednesday. Pt states she recently ate cupcakes that were made with essential oils and then made her sick. Pt states that twins run in both sides of family, previous pregnancy was twin preg.

## 2016-11-28 NOTE — Progress Notes (Signed)
Subjective:    Jenna Mcgrath is being seen today for her first obstetrical visit.  This is a planned pregnancy. She is at [redacted]w[redacted]d gestation. Her obstetrical history is significant for obesity and irregular periods. Relationship with FOB: significant other, living together. Patient does intend to breast feed. Pregnancy history fully reviewed.  Reports irregular periods.  Unsure of dating, had short period sometime in May.  Reports N&V.  Is starting employment with [employer] in September.    The information documented in the HPI was reviewed and verified.  Menstrual History: OB History    Gravida Para Term Preterm AB Living   4 1 1   2 1    SAB TAB Ectopic Multiple Live Births   1 1     1        Patient's last menstrual period was 09/08/2016. Unsure.     History reviewed. No pertinent past medical history.  Past Surgical History:  Procedure Laterality Date  . TONSILLECTOMY AND ADENOIDECTOMY    . VULVA SURGERY       (Not in a hospital admission) No Known Allergies  Social History  Substance Use Topics  . Smoking status: Light Tobacco Smoker    Packs/day: 0.50    Types: Cigarettes    Last attempt to quit: 10/07/2016  . Smokeless tobacco: Never Used     Comment: heavy smoker before pregnancy, not much since confirm pregnanacy  . Alcohol use No     Comment: Socially    Family History  Problem Relation Age of Onset  . Cancer Maternal Grandmother   . Diabetes Maternal Grandmother   . Diabetes Paternal Grandmother   . Colon cancer Paternal Grandfather      Review of Systems Constitutional: negative for weight loss Gastrointestinal: negative for vomiting Genitourinary:negative for genital lesions and vaginal discharge and dysuria Musculoskeletal:negative for back pain Behavioral/Psych: negative for abusive relationship, depression, illegal drug usage and tobacco use    Objective:    BP 124/75   Pulse 88   Wt 203 lb (92.1 kg)   LMP 09/08/2016   BMI 31.79 kg/m  General  Appearance:    Alert, cooperative, no distress, appears stated age  Head:    Normocephalic, without obvious abnormality, atraumatic  Eyes:    PERRL, conjunctiva/corneas clear, EOM's intact, fundi    benign, both eyes  Ears:    Normal TM's and external ear canals, both ears  Nose:   Nares normal, septum midline, mucosa normal, no drainage    or sinus tenderness  Throat:   Lips, mucosa, and tongue normal; teeth and gums normal  Neck:   Supple, symmetrical, trachea midline, no adenopathy;    thyroid:  no enlargement/tenderness/nodules; no carotid   bruit or JVD  Back:     Symmetric, no curvature, ROM normal, no CVA tenderness  Lungs:     Clear to auscultation bilaterally, respirations unlabored  Chest Wall:    No tenderness or deformity   Heart:    Regular rate and rhythm, S1 and S2 normal, no murmur, rub   or gallop  Breast Exam:    No tenderness, masses, or nipple abnormality  Abdomen:     Soft, non-tender, bowel sounds active all four quadrants,    no masses, no organomegaly  Genitalia:    Normal female without lesion, discharge or tenderness  Extremities:   Extremities normal, atraumatic, no cyanosis or edema  Pulses:   2+ and symmetric all extremities  Skin:   Skin color, texture, turgor normal, no rashes or  lesions  Lymph nodes:   Cervical, supraclavicular, and axillary nodes normal  Neurologic:   CNII-XII intact, normal strength, sensation and reflexes    throughout  [place] dating: [redacted]w[redacted]d.  [place] pregnancy.  +cardiac activity and fetal movement noted.  Single gestational sack.     Lab Review Urine pregnancy test Labs reviewed yes Radiologic studies reviewed no  Assessment & Plan    Pregnancy at [redacted]w[redacted]d weeks   Unsure of LMP as reason for [place]      Prenatal vitamins.  Counseling provided regarding continued use of seat belts, cessation of alcohol consumption, smoking or use of illicit drugs; infection precautions i.e., influenza/TDAP immunizations, toxoplasmosis,CMV,  parvovirus, listeria and varicella; workplace safety, exercise during pregnancy; routine dental care, safe medications, sexual activity, hot tubs, saunas, pools, travel, caffeine use, fish and methlymercury, potential toxins, hair treatments, varicose veins Weight gain recommendations per IOM guidelines reviewed: underweight/BMI< 18.5--> gain 28 - 40 lbs; normal weight/BMI 18.5 - 24.9--> gain 25 - 35 lbs; overweight/BMI 25 - 29.9--> gain 15 - 25 lbs; obese/BMI >30->gain  11 - 20 lbs Problem list reviewed and updated. FIRST/CF mutation testing/NIPT/QUAD SCREEN/fragile X/Ashkenazi Jewish population testing/Spinal muscular atrophy discussed: ordered. Role of ultrasound in pregnancy discussed; fetal survey: requested. Amniocentesis discussed: not indicated.  No orders of the defined types were placed in this encounter.  Orders Placed This Encounter  Procedures  . Culture, OB Urine  . VITAMIN D 25 Hydroxy (Vit-D Deficiency, Fractures)  . Obstetric Panel, Including HIV  . Hemoglobinopathy evaluation  . Varicella zoster antibody, IgG  . MaterniT21 PLUS Core+SCA    Order Specific Question:   Is the patient insulin dependent?    Answer:   No    Order Specific Question:   Please enter gestational age. This should be expressed as weeks AND days, i.e. 16w 6d. Enter weeks here. Enter days in next question.    Answer:   37    Order Specific Question:   Please enter gestational age. This should be expressed as weeks AND days, i.e. 16w 6d. Enter days here. Enter weeks in previous question.    Answer:   4    Order Specific Question:   How was gestational age calculated?    Answer:   LMP    Order Specific Question:   Please give the date of LMP OR Ultrasound OR Estimated date of delivery.    Answer:   06/15/2016    Order Specific Question:   Number of Fetuses (Type of Pregnancy):    Answer:   1    Order Specific Question:   Indications for performing the test? (please choose all that apply):    Answer:    Routine screening    Order Specific Question:   Other Indications? (Y=Yes, N=No)    Answer:   N    Order Specific Question:   If this is a repeat specimen, please indicate the reason:    Answer:   Not indicated    Order Specific Question:   Please specify the patient's race: (C=White/Caucasion, B=Black, I=Native American, A=Asian, H=Hispanic, O=Other, U=Unknown)    Answer:   B    Order Specific Question:   Donor Egg - indicate if the egg was obtained from in vitro fertilization.    Answer:   N    Order Specific Question:   Age of Egg Donor.    Answer:   89    Order Specific Question:   Prior Down Syndrome/ONTD screening during current pregnancy.  Answer:   N    Order Specific Question:   Prior First Trimester Testing    Answer:   N    Order Specific Question:   Prior Second Trimester Testing    Answer:   N    Order Specific Question:   Family History of Neural Tube Defects    Answer:   N    Order Specific Question:   Prior Pregnancy with Down Syndrome    Answer:   N    Order Specific Question:   Please give the patient's weight (in pounds)    Answer:   203  . Hemoglobin A1c  . Cystic Fibrosis Mutation 97    Follow up in 4 weeks. 50% of 30 min visit spent on counseling and coordination of care.

## 2016-11-29 ENCOUNTER — Encounter: Payer: Self-pay | Admitting: *Deleted

## 2016-11-30 LAB — CERVICOVAGINAL ANCILLARY ONLY
Bacterial vaginitis: NEGATIVE
CHLAMYDIA, DNA PROBE: POSITIVE — AB
Candida vaginitis: NEGATIVE
Neisseria Gonorrhea: NEGATIVE
Trichomonas: NEGATIVE

## 2016-12-01 ENCOUNTER — Other Ambulatory Visit: Payer: Self-pay | Admitting: Obstetrics

## 2016-12-01 ENCOUNTER — Other Ambulatory Visit: Payer: Self-pay | Admitting: *Deleted

## 2016-12-01 DIAGNOSIS — A749 Chlamydial infection, unspecified: Secondary | ICD-10-CM

## 2016-12-01 LAB — OBSTETRIC PANEL, INCLUDING HIV
Antibody Screen: NEGATIVE
BASOS ABS: 0 10*3/uL (ref 0.0–0.2)
Basos: 0 %
EOS (ABSOLUTE): 0.1 10*3/uL (ref 0.0–0.4)
EOS: 1 %
HEMOGLOBIN: 12.7 g/dL (ref 11.1–15.9)
HEP B S AG: NEGATIVE
HIV Screen 4th Generation wRfx: NONREACTIVE
Hematocrit: 38.9 % (ref 34.0–46.6)
IMMATURE GRANS (ABS): 0 10*3/uL (ref 0.0–0.1)
IMMATURE GRANULOCYTES: 1 %
LYMPHS: 24 %
Lymphocytes Absolute: 1.9 10*3/uL (ref 0.7–3.1)
MCH: 29.1 pg (ref 26.6–33.0)
MCHC: 32.6 g/dL (ref 31.5–35.7)
MCV: 89 fL (ref 79–97)
MONOS ABS: 0.7 10*3/uL (ref 0.1–0.9)
Monocytes: 9 %
NEUTROS PCT: 65 %
Neutrophils Absolute: 5 10*3/uL (ref 1.4–7.0)
Platelets: 318 10*3/uL (ref 150–379)
RBC: 4.37 x10E6/uL (ref 3.77–5.28)
RDW: 13.7 % (ref 12.3–15.4)
RH TYPE: POSITIVE
RPR: NONREACTIVE
Rubella Antibodies, IGG: 1.4 index (ref >0.99)
WBC: 7.7 10*3/uL (ref 3.4–10.8)

## 2016-12-01 LAB — HEMOGLOBINOPATHY EVALUATION
HGB A: 97.8 % (ref 96.4–98.8)
HGB C: 0 %
HGB S: 0 %
HGB VARIANT: 0 %
Hemoglobin A2 Quantitation: 2.2 % (ref 1.8–3.2)
Hemoglobin F Quantitation: 0 % (ref 0.0–2.0)

## 2016-12-01 LAB — HEMOGLOBIN A1C
ESTIMATED AVERAGE GLUCOSE: 103 mg/dL
Hgb A1c MFr Bld: 5.2 % (ref 4.8–5.6)

## 2016-12-01 LAB — CYTOLOGY - PAP: Diagnosis: NEGATIVE

## 2016-12-01 LAB — VARICELLA ZOSTER ANTIBODY, IGG: Varicella zoster IgG: >4000 index (ref >165)

## 2016-12-01 LAB — VITAMIN D 25 HYDROXY (VIT D DEFICIENCY, FRACTURES): Vit D, 25-Hydroxy: 23.1 ng/mL — ABNORMAL LOW (ref 30.0–100.0)

## 2016-12-01 MED ORDER — AZITHROMYCIN 250 MG PO TABS: 1000.0000 mg | ORAL_TABLET | Freq: Once | ORAL | 0 refills | Status: DC

## 2016-12-01 NOTE — Progress Notes (Unsigned)
Patient notified regarding her results. Rx sent to pharmacy per protocol sheet.

## 2016-12-02 LAB — URINE CULTURE, OB REFLEX

## 2016-12-02 LAB — CULTURE, OB URINE

## 2016-12-05 ENCOUNTER — Other Ambulatory Visit: Payer: Self-pay | Admitting: Certified Nurse Midwife

## 2016-12-05 DIAGNOSIS — A568 Sexually transmitted chlamydial infection of other sites: Secondary | ICD-10-CM | POA: Insufficient documentation

## 2016-12-05 DIAGNOSIS — Z3480 Encounter for supervision of other normal pregnancy, unspecified trimester: Secondary | ICD-10-CM

## 2016-12-05 DIAGNOSIS — E559 Vitamin D deficiency, unspecified: Secondary | ICD-10-CM | POA: Insufficient documentation

## 2016-12-05 DIAGNOSIS — O98312 Other infections with a predominantly sexual mode of transmission complicating pregnancy, second trimester: Secondary | ICD-10-CM

## 2016-12-05 DIAGNOSIS — B951 Streptococcus, group B, as the cause of diseases classified elsewhere: Secondary | ICD-10-CM | POA: Insufficient documentation

## 2016-12-05 LAB — MATERNIT21 PLUS CORE+SCA
Chromosome 13: NEGATIVE
Chromosome 18: NEGATIVE
Chromosome 21: NEGATIVE
Y Chromosome: NOT DETECTED

## 2016-12-05 MED ORDER — VITAMIN D (ERGOCALCIFEROL) 1.25 MG (50000 UNIT) PO CAPS: 50000.0000 [IU] | ORAL_CAPSULE | ORAL | 2 refills | Status: DC

## 2016-12-05 MED ORDER — AZITHROMYCIN 250 MG PO TABS: 1000.0000 mg | ORAL_TABLET | Freq: Once | ORAL | 0 refills | Status: AC

## 2016-12-05 MED ORDER — AZITHROMYCIN 250 MG PO TABS: ORAL_TABLET | ORAL | 0 refills | Status: DC

## 2016-12-05 NOTE — Telephone Encounter (Signed)
Patient notified of results and Rx. 

## 2016-12-05 NOTE — Telephone Encounter (Signed)
-----   Message from Roe Coombs, CNM sent at 12/05/2016 10:11 AM EDT ----- Please call her and let her know that Rx for Azithromyacin has been sent to the pharmacy for the Chlamydia. Please tell her to have her partner treated and not to have sexual intercourse until they have both completed treatment.  Please also schedule her for f/u TOC in 4-6 weeks.  Please notify the HD. Please let her know that her vitamin D level is low.  Vitamin D weekly tablet has been sent to her pharmacy for her to take.   Thank you.   Felisa Bonier CNM

## 2016-12-06 ENCOUNTER — Other Ambulatory Visit: Payer: Self-pay | Admitting: Certified Nurse Midwife

## 2016-12-06 DIAGNOSIS — Z3480 Encounter for supervision of other normal pregnancy, unspecified trimester: Secondary | ICD-10-CM

## 2016-12-06 LAB — CYSTIC FIBROSIS MUTATION 97: GENE DIS ANAL CARRIER INTERP BLD/T-IMP: NOT DETECTED

## 2016-12-07 ENCOUNTER — Other Ambulatory Visit: Payer: Self-pay

## 2016-12-07 ENCOUNTER — Inpatient Hospital Stay (HOSPITAL_COMMUNITY)
Admission: AD | Admit: 2016-12-07 | Discharge: 2016-12-07 | Disposition: A | Discharge status: Home / Self Care | Payer: Medicaid Other | Source: Ambulatory Visit | Attending: Family Medicine | Admitting: Family Medicine

## 2016-12-07 ENCOUNTER — Encounter (HOSPITAL_COMMUNITY): Payer: Self-pay

## 2016-12-07 DIAGNOSIS — O210 Mild hyperemesis gravidarum: Secondary | ICD-10-CM | POA: Diagnosis present

## 2016-12-07 DIAGNOSIS — Z3A14 14 weeks gestation of pregnancy: Secondary | ICD-10-CM | POA: Insufficient documentation

## 2016-12-07 DIAGNOSIS — R1011 Right upper quadrant pain: Secondary | ICD-10-CM | POA: Diagnosis not present

## 2016-12-07 DIAGNOSIS — O99332 Smoking (tobacco) complicating pregnancy, second trimester: Secondary | ICD-10-CM | POA: Diagnosis not present

## 2016-12-07 DIAGNOSIS — F17210 Nicotine dependence, cigarettes, uncomplicated: Secondary | ICD-10-CM | POA: Diagnosis not present

## 2016-12-07 DIAGNOSIS — O219 Vomiting of pregnancy, unspecified: Secondary | ICD-10-CM | POA: Diagnosis not present

## 2016-12-07 DIAGNOSIS — T402X5A Adverse effect of other opioids, initial encounter: Secondary | ICD-10-CM | POA: Diagnosis not present

## 2016-12-07 DIAGNOSIS — O26892 Other specified pregnancy related conditions, second trimester: Secondary | ICD-10-CM | POA: Insufficient documentation

## 2016-12-07 DIAGNOSIS — R1012 Left upper quadrant pain: Secondary | ICD-10-CM | POA: Insufficient documentation

## 2016-12-07 DIAGNOSIS — E559 Vitamin D deficiency, unspecified: Secondary | ICD-10-CM

## 2016-12-07 DIAGNOSIS — T39315A Adverse effect of propionic acid derivatives, initial encounter: Secondary | ICD-10-CM | POA: Diagnosis not present

## 2016-12-07 DIAGNOSIS — Z3480 Encounter for supervision of other normal pregnancy, unspecified trimester: Secondary | ICD-10-CM

## 2016-12-07 DIAGNOSIS — R112 Nausea with vomiting, unspecified: Secondary | ICD-10-CM

## 2016-12-07 DIAGNOSIS — Z789 Other specified health status: Secondary | ICD-10-CM

## 2016-12-07 HISTORY — DX: Other specified health status: Z78.9

## 2016-12-07 LAB — URINALYSIS, ROUTINE W REFLEX MICROSCOPIC
BILIRUBIN URINE: NEGATIVE
Glucose, UA: NEGATIVE mg/dL
HGB URINE DIPSTICK: NEGATIVE
Ketones, ur: NEGATIVE mg/dL
NITRITE: NEGATIVE
PROTEIN: NEGATIVE mg/dL
Specific Gravity, Urine: 1.018 (ref 1.005–1.030)
pH: 6 (ref 5.0–8.0)

## 2016-12-07 LAB — COMPREHENSIVE METABOLIC PANEL
ALBUMIN: 3.6 g/dL (ref 3.5–5.0)
ALT: 13 U/L — ABNORMAL LOW (ref 14–54)
ANION GAP: 9 (ref 5–15)
AST: 18 U/L (ref 15–41)
Alkaline Phosphatase: 66 U/L (ref 38–126)
BUN: 10 mg/dL (ref 6–20)
CHLORIDE: 102 mmol/L (ref 101–111)
CO2: 23 mmol/L (ref 22–32)
CREATININE: 0.57 mg/dL (ref 0.44–1.00)
Calcium: 9.1 mg/dL (ref 8.9–10.3)
GFR calc Af Amer: >60 mL/min (ref >60)
GLUCOSE: 86 mg/dL (ref 65–99)
POTASSIUM: 3.5 mmol/L (ref 3.5–5.1)
Sodium: 134 mmol/L — ABNORMAL LOW (ref 135–145)
Total Bilirubin: 0.6 mg/dL (ref 0.3–1.2)
Total Protein: 7.6 g/dL (ref 6.5–8.1)

## 2016-12-07 LAB — CBC
HCT: 36.7 % (ref 36.0–46.0)
Hemoglobin: 12.6 g/dL (ref 12.0–15.0)
MCH: 29.2 pg (ref 26.0–34.0)
MCHC: 34.3 g/dL (ref 30.0–36.0)
MCV: 85.2 fL (ref 78.0–100.0)
PLATELETS: 309 10*3/uL (ref 150–400)
RBC: 4.31 MIL/uL (ref 3.87–5.11)
RDW: 13 % (ref 11.5–15.5)
WBC: 9.7 10*3/uL (ref 4.0–10.5)

## 2016-12-07 LAB — LIPASE, BLOOD: Lipase: 22 U/L (ref 11–51)

## 2016-12-07 MED ORDER — ACETAMINOPHEN 500 MG PO TABS
1000.0000 mg | ORAL_TABLET | Freq: Once | ORAL | Status: AC
  Administered 2016-12-07: 1000 mg via ORAL

## 2016-12-07 MED ORDER — ONDANSETRON 4 MG PO TBDP: 4.0000 mg | ORAL_TABLET | Freq: Three times a day (TID) | ORAL | 0 refills | Status: DC | PRN

## 2016-12-07 MED ORDER — LACTATED RINGERS IV BOLUS (SEPSIS)
1000.0000 mL | Freq: Once | INTRAVENOUS | Status: AC
  Administered 2016-12-07: 1000 mL via INTRAVENOUS

## 2016-12-07 MED ORDER — ACETAMINOPHEN 500 MG PO TABS
ORAL_TABLET | ORAL | Status: AC
  Filled 2016-12-07: qty 2

## 2016-12-07 MED ORDER — VITAMIN D (ERGOCALCIFEROL) 1.25 MG (50000 UNIT) PO CAPS: 50000.0000 [IU] | ORAL_CAPSULE | ORAL | 2 refills | Status: DC

## 2016-12-07 MED ORDER — DOXYLAMINE-PYRIDOXINE 10-10 MG PO TBEC: 1.0000 | DELAYED_RELEASE_TABLET | Freq: Three times a day (TID) | ORAL | 99 refills | Status: DC

## 2016-12-07 MED ORDER — ONDANSETRON HCL 40 MG/20ML IJ SOLN
8.0000 mg | Freq: Once | INTRAMUSCULAR | Status: AC
  Administered 2016-12-07: 8 mg via INTRAVENOUS
  Filled 2016-12-07: qty 4

## 2016-12-07 NOTE — MAU Note (Signed)
Had wisdom tooth pulled yesterday, took pain meds but they keep coming back up. Abdominal muscles are sore from vomiting, vomited about 12 times in the last 24 hours.

## 2016-12-07 NOTE — MAU Provider Note (Signed)
History     CSN: [id number]  Arrival date and time: 12/07/16 0020   First Provider Initiated Contact with Patient 12/07/16 0104      Chief Complaint  Patient presents with  . Emesis   G4P1021 @14 .2 wks here with N/V. Sx started 1.5 days ago after she took Ibuprofen and Vicodin post wisdom tooth extraction. She cannot tolerate po food or fluids. Reports vomiting in excess of 12 times. Reports bilateral upper abdominal pain with vomiting. No diarrhea. No fever. No pregnancy complaints.     OB History    Gravida Para Term Preterm AB Living   4 1 1   2 1    SAB TAB Ectopic Multiple Live Births   1 1     1       Past Medical History:  Diagnosis Date  . Medical history non-contributory     Past Surgical History:  Procedure Laterality Date  . TONSILLECTOMY AND ADENOIDECTOMY    . VULVA SURGERY      Family History  Problem Relation Age of Onset  . Cancer Maternal Grandmother   . Diabetes Maternal Grandmother   . Diabetes Paternal Grandmother   . Colon cancer Paternal Grandfather     Social History  Substance Use Topics  . Smoking status: Light Tobacco Smoker    Packs/day: 0.50    Types: Cigarettes    Last attempt to quit: 10/07/2016  . Smokeless tobacco: Never Used     Comment: heavy smoker before pregnancy, not much since confirm pregnanacy  . Alcohol use No     Comment: Socially    Allergies: No Known Allergies  Prescriptions Prior to Admission  Medication Sig Dispense Refill Last Dose  . ibuprofen (ADVIL,MOTRIN) 200 MG tablet Take 200 mg by mouth every 6 (six) hours as needed.     . Prenat-FeAsp-Meth-FA-DHA w/o A (PRENATE PIXIE) 10-0.6-0.4-200 MG CAPS Take 1 tablet by mouth daily. 30 capsule 12 12/06/2016 at Unknown time  . azithromycin (ZITHROMAX) 250 MG tablet Take 4 tablets all together now. 4 tablet 0   . Doxylamine-Pyridoxine (DICLEGIS) 10-10 MG TBEC Take 1 tablet by mouth 3 (three) times daily. 1 tab in AM, 1 tab mid afternoon 2 tabs at bedtime. Max dose 4  tabs daily. 100 tablet prn   . promethazine (PHENERGAN) 25 MG suppository Place 1 suppository (25 mg total) rectally every 6 (six) hours as needed for nausea or vomiting. 12 suppository 0 Taking  . promethazine (PHENERGAN) 25 MG tablet Take 0.5-1 tablets (12.5-25 mg total) by mouth every 6 (six) hours as needed for nausea. 30 tablet 2 Taking  . Vitamin D, Ergocalciferol, (DRISDOL) 50000 units CAPS capsule Take 1 capsule (50,000 Units total) by mouth every 7 (seven) days. 30 capsule 2     Review of Systems  Gastrointestinal: Positive for abdominal pain, nausea and vomiting. Negative for diarrhea.  Genitourinary: Negative for vaginal bleeding.  Neurological: Positive for headaches.   Physical Exam   Blood pressure 139/64, pulse 77, temperature 98.2 F (36.8 C), temperature source Oral, resp. rate 18, height 5\' 7"  (1.702 m), weight 204 lb (92.5 kg), last menstrual period 09/08/2016, SpO2 100 %.  Physical Exam  Constitutional: She is oriented to person, place, and time. She appears well-developed and well-nourished. No distress.  HENT:  Head: Normocephalic and atraumatic.  Neck: Normal range of motion.  Cardiovascular: Normal rate.   Respiratory: Effort normal. No respiratory distress.  GI: Soft. She exhibits no distension and no mass. There is tenderness (upper quadrants). There is  no rebound and no guarding.  Musculoskeletal: Normal range of motion.  Neurological: She is alert and oriented to person, place, and time.  Skin: Skin is warm and dry.  Psychiatric: She has a normal mood and affect.  FHT 143  Results for orders placed or performed during the hospital encounter of 12/07/16 (from the past 24 hour(s))  Urinalysis, Routine w reflex microscopic     Status: Abnormal   Collection Time: 12/07/16 12:35 AM  Result Value Ref Range   Color, Urine YELLOW YELLOW   APPearance CLOUDY (A) CLEAR   Specific Gravity, Urine 1.018 1.005 - 1.030   pH 6.0 5.0 - 8.0   Glucose, UA NEGATIVE  NEGATIVE mg/dL   Hgb urine dipstick NEGATIVE NEGATIVE   Bilirubin Urine NEGATIVE NEGATIVE   Ketones, ur NEGATIVE NEGATIVE mg/dL   Protein, ur NEGATIVE NEGATIVE mg/dL   Nitrite NEGATIVE NEGATIVE   Leukocytes, UA SMALL (A) NEGATIVE   RBC / HPF 0-5 0 - 5 RBC/hpf   WBC, UA 0-5 0 - 5 WBC/hpf   Bacteria, UA RARE (A) NONE SEEN   Squamous Epithelial / LPF 6-30 (A) NONE SEEN   Mucus PRESENT   CBC     Status: None   Collection Time: 12/07/16  1:32 AM  Result Value Ref Range   WBC 9.7 4.0 - 10.5 K/uL   RBC 4.31 3.87 - 5.11 MIL/uL   Hemoglobin 12.6 12.0 - 15.0 g/dL   HCT 81.1 91.4 - 78.2 %   MCV 85.2 78.0 - 100.0 fL   MCH 29.2 26.0 - 34.0 pg   MCHC 34.3 30.0 - 36.0 g/dL   RDW 95.6 21.3 - 08.6 %   Platelets 309 150 - 400 K/uL  Comprehensive metabolic panel     Status: Abnormal   Collection Time: 12/07/16  1:32 AM  Result Value Ref Range   Sodium 134 (L) 135 - 145 mmol/L   Potassium 3.5 3.5 - 5.1 mmol/L   Chloride 102 101 - 111 mmol/L   CO2 23 22 - 32 mmol/L   Glucose, Bld 86 65 - 99 mg/dL   BUN 10 6 - 20 mg/dL   Creatinine, Ser 5.78 0.44 - 1.00 mg/dL   Calcium 9.1 8.9 - 46.9 mg/dL   Total Protein 7.6 6.5 - 8.1 g/dL   Albumin 3.6 3.5 - 5.0 g/dL   AST 18 15 - 41 U/L   ALT 13 (L) 14 - 54 U/L   Alkaline Phosphatase 66 38 - 126 U/L   Total Bilirubin 0.6 0.3 - 1.2 mg/dL   GFR calc non Af Amer >60 >60 mL/min   GFR calc Af Amer >60 >60 mL/min   Anion gap 9 5 - 15  Lipase, blood     Status: None   Collection Time: 12/07/16  1:32 AM  Result Value Ref Range   Lipase 22 11 - 51 U/L    MAU Course  Procedures LR bolus Zofran Tylenol  MDM Labs ordered and reviewed. Nausea improved. No emesis. Tolerating po now. HA improved. Sx likely caused by medication intolerance-recommend discontinue Ibuprofen and Vicodin. Stable for discharge home.  Assessment and Plan   1. Drug intolerance   2. Supervision of other normal pregnancy, antepartum   3. [redacted] weeks gestation of pregnancy   4.  Non-intractable vomiting with nausea, unspecified vomiting type    Discharge home Follow up at [facility] visit as scheduled Rx Zofran Tylenol prn BRAT diet Maintain hydration  Allergies as of 12/07/2016   No Known Allergies  Medication List    STOP taking these medications   ibuprofen 200 MG tablet Commonly known as:  ADVIL,MOTRIN   promethazine 25 MG suppository Commonly known as:  PHENERGAN   promethazine 25 MG tablet Commonly known as:  PHENERGAN     TAKE these medications   azithromycin 250 MG tablet Commonly known as:  ZITHROMAX Take 4 tablets all together now.   Doxylamine-Pyridoxine 10-10 MG Tbec Commonly known as:  DICLEGIS Take 1 tablet by mouth 3 (three) times daily. 1 tab in AM, 1 tab mid afternoon 2 tabs at bedtime. Max dose 4 tabs daily.   ondansetron 4 MG disintegrating tablet Commonly known as:  ZOFRAN ODT Take 1 tablet (4 mg total) by mouth every 8 (eight) hours as needed for nausea or vomiting.   PRENATE PIXIE 10-0.6-0.4-200 MG Caps Take 1 tablet by mouth daily.   Vitamin D (Ergocalciferol) 50000 units Caps capsule Commonly known as:  DRISDOL Take 1 capsule (50,000 Units total) by mouth every 7 (seven) days.      Donette Larry, CNM 12/07/2016, 1:12 AM

## 2016-12-07 NOTE — Discharge Instructions (Signed)
Nausea, Adult Feeling sick to your stomach (nausea) means that your stomach is upset or you feel like you have to throw up (vomit). Feeling sick to your stomach is usually not serious, but it may be an early sign of a more serious medical problem. As you feel sicker to your stomach, it can lead to throwing up (vomiting). If you throw up, or if you are not able to drink enough fluids, there is a risk of dehydration. Dehydration can make you feel tired and thirsty, have a dry mouth, and pee (urinate) less often. Older adults and people who have other diseases or a weak defense (immune) system have a higher risk of dehydration. The main goal of treating this condition is to:  Limit how often you feel sick to your stomach.  Prevent throwing up and dehydration.  Follow these instructions at home: Follow instructions from your doctor about how to care for yourself at home. Eating and drinking Follow these recommendations as told by your doctor:  Take an oral rehydration solution (ORS). This is a drink that is sold at pharmacies and stores.  Drink clear fluids in small amounts as you are able, such as: ? Water. ? Ice chips. ? Fruit juice that has water added (diluted fruit juice). ? Low-calorie sports drinks.  Eat bland, easy to digest foods in small amounts as you are able, such as: ? Bananas. ? Applesauce. ? Rice. ? Lean meats. ? Toast. ? Crackers.  Avoid drinking fluids that contain a lot of sugar or caffeine.  Avoid alcohol.  Avoid spicy or fatty foods.  General instructions  Drink enough fluid to keep your pee (urine) clear or pale yellow.  Wash your hands often. If you cannot use soap and water, use hand sanitizer.  Make sure that all people in your household wash their hands well and often.  Rest at home while you get better.  Take over-the-counter and prescription medicines only as told by your doctor.  Breathe slowly and deeply when you feel sick to your  stomach.  Watch your condition for any changes.  Keep all follow-up visits as told by your doctor. This is important. Contact a doctor if:  You have a headache.  You have new symptoms.  You feel sicker to your stomach.  You have a fever.  You feel light-headed or dizzy.  You throw up.  You are not able to keep fluids down. Get help right away if:  You have pain in your chest, neck, arm, or jaw.  You feel very weak or you pass out (faint).  You have throw up that is bright red or looks like coffee grounds.  You have bloody or black poop (stools), or poop that looks like tar.  You have a very bad headache, a stiff neck, or both.  You have very bad pain, cramping, or bloating in your belly.  You have a rash.  You have trouble breathing or you are breathing very quickly.  Your heart is beating very quickly.  Your skin feels cold and clammy.  You feel confused.  You have pain while peeing.  You have signs of dehydration, such as: ? Dark pee, or very little or no pee. ? Cracked lips. ? Dry mouth. ? Sunken eyes. ? Sleepiness. ? Weakness. These symptoms may be an emergency. Do not wait to see if the symptoms will go away. Get medical help right away. Call your local emergency services (911 in the [place] Do not drive yourself to  the hospital. This information is not intended to replace advice given to you by your health care provider. Make sure you discuss any questions you have with your health care provider. Document Released: 03/24/2011 Document Revised: 09/10/2015 Document Reviewed: 12/09/2014 Elsevier Interactive Patient Education  2018 [employer]. [place] Diet A bland diet consists of foods that do not have a lot of fat or fiber. Foods without fat or fiber are easier for the body to digest. They are also less likely to irritate your mouth, throat, stomach, and other parts of your gastrointestinal tract. A bland diet is sometimes called a BRAT diet. What is my  plan? Your health care provider or dietitian may recommend specific changes to your diet to prevent and treat your symptoms, such as:  Eating small meals often.  Cooking food until it is soft enough to chew easily.  Chewing your food well.  Drinking fluids slowly.  Not eating foods that are very spicy, sour, or fatty.  Not eating citrus fruits, such as oranges and grapefruit.  What do I need to know about this diet?  Eat a variety of foods from the bland diet food list.  Do not follow a bland diet longer than you have to.  Ask your health care provider whether you should take vitamins. What foods can I eat? Grains  Hot cereals, such as cream of wheat. Bread, crackers, or tortillas made from refined white flour. Rice. Vegetables Canned or cooked vegetables. Mashed or boiled potatoes. Fruits Bananas. Applesauce. Other types of cooked or canned fruit with the skin and seeds removed, such as canned peaches or pears. Meats and Other Protein Sources Scrambled eggs. Creamy peanut butter or other nut butters. Lean, well-cooked meats, such as chicken or fish. Tofu. Soups or broths. Dairy Low-fat dairy products, such as milk, cottage cheese, or yogurt. Beverages Water. Herbal tea. Apple juice. Sweets and Desserts Pudding. Custard. Fruit gelatin. Ice cream. Fats and Oils Mild salad dressings. Canola or olive oil. The items listed above may not be a complete list of allowed foods or beverages. Contact your dietitian for more options. What foods are not recommended? Foods and ingredients that are often not recommended include:  Spicy foods, such as hot sauce or salsa.  Fried foods.  Sour foods, such as pickled or fermented foods.  Raw vegetables or fruits, especially citrus or berries.  Caffeinated drinks.  Alcohol.  Strongly flavored seasonings or condiments.  The items listed above may not be a complete list of foods and beverages that are not allowed. Contact your  dietitian for more information. This information is not intended to replace advice given to you by your health care provider. Make sure you discuss any questions you have with your health care provider. Document Released: 07/27/2015 Document Revised: 09/10/2015 Document Reviewed: 04/16/2014 Elsevier Interactive Patient Education  2018 [employer].

## 2016-12-23 ENCOUNTER — Ambulatory Visit (INDEPENDENT_AMBULATORY_CARE_PROVIDER_SITE_OTHER): Payer: Medicaid Other | Admitting: Certified Nurse Midwife

## 2016-12-23 ENCOUNTER — Other Ambulatory Visit (HOSPITAL_COMMUNITY)
Admission: RE | Admit: 2016-12-23 | Discharge: 2016-12-23 | Disposition: A | Discharge status: Home / Self Care | Payer: Medicaid Other | Source: Ambulatory Visit | Attending: Certified Nurse Midwife | Admitting: Certified Nurse Midwife

## 2016-12-23 VITALS — BP 124/76 | HR 80 | Wt 202.9 lb

## 2016-12-23 DIAGNOSIS — Z3482 Encounter for supervision of other normal pregnancy, second trimester: Secondary | ICD-10-CM | POA: Insufficient documentation

## 2016-12-23 DIAGNOSIS — A749 Chlamydial infection, unspecified: Secondary | ICD-10-CM

## 2016-12-23 DIAGNOSIS — Z3480 Encounter for supervision of other normal pregnancy, unspecified trimester: Secondary | ICD-10-CM | POA: Diagnosis present

## 2016-12-23 DIAGNOSIS — B951 Streptococcus, group B, as the cause of diseases classified elsewhere: Secondary | ICD-10-CM

## 2016-12-23 DIAGNOSIS — A568 Sexually transmitted chlamydial infection of other sites: Secondary | ICD-10-CM

## 2016-12-23 DIAGNOSIS — O98312 Other infections with a predominantly sexual mode of transmission complicating pregnancy, second trimester: Secondary | ICD-10-CM

## 2016-12-23 DIAGNOSIS — O98812 Other maternal infectious and parasitic diseases complicating pregnancy, second trimester: Secondary | ICD-10-CM

## 2016-12-23 DIAGNOSIS — E559 Vitamin D deficiency, unspecified: Secondary | ICD-10-CM

## 2016-12-23 LAB — POCT URINALYSIS DIPSTICK
BILIRUBIN UA: NEGATIVE
Blood, UA: NEGATIVE
GLUCOSE UA: NEGATIVE
Leukocytes, UA: NEGATIVE
Nitrite, UA: NEGATIVE
SPEC GRAV UA: 1.015 (ref 1.010–1.025)
UROBILINOGEN UA: 0.2 U/dL
pH, UA: 6 (ref 5.0–8.0)

## 2016-12-23 NOTE — Progress Notes (Signed)
Patient reports feeling fetal movement, reports that she has irritability after urinating, advised to leave a urine sample.

## 2016-12-23 NOTE — Progress Notes (Signed)
   PRENATAL VISIT NOTE  Subjective:  Jenna DowdyKani Lanz is a 26 y.o. [phone] at 7129w4d being seen today for ongoing prenatal care.  She is currently monitored for the following issues for this low-risk pregnancy and has Supervision of other normal pregnancy, antepartum; Positive GBS test; Chlamydia trachomatis infection in mother during second trimester of pregnancy; and Vitamin D deficiency on her problem list.  Patient reports nausea, no bleeding, no contractions, no cramping, no leaking and vomiting.  Contractions: Not present. Vag. Bleeding: None.  Movement: Present. Denies leaking of fluid.   The following portions of the patient's history were reviewed and updated as appropriate: allergies, current medications, past family history, past medical history, past social history, past surgical history and problem list. Problem list updated.  Objective:   Vitals:   12/23/16 0921  BP: 124/76  Pulse: 80  Weight: 202 lb 14.4 oz (92 kg)    Fetal Status: Fetal Heart Rate (bpm): 135; doppler   Movement: Present     General:  Alert, oriented and cooperative. Patient is in no acute distress.  Skin: Skin is warm and dry. No rash noted.   Cardiovascular: Normal heart rate noted  Respiratory: Normal respiratory effort, no problems with respiration noted  Abdomen: Soft, gravid, appropriate for gestational age.  Pain/Pressure: Present     Pelvic: Cervical exam deferred        Extremities: Normal range of motion.  Edema: None  Mental Status:  Normal mood and affect. Normal behavior. Normal judgment and thought content.   Assessment and Plan:  Pregnancy: G4P1021 at 2529w4d  1. Supervision of other normal pregnancy, antepartum      Unable to keep fluids down: has tried gatoraide, poweraide, gingerale, sprite, water.  Is starting a new job on Monday and is concerned over her dehydration.  Zofran, Diclegis and phenergan have not worked for the N&V.  Emesis at least 2X/day.   - AFP, Serum, Open Spina Bifida -  US MFM OB DETAIL +14 WK; Future  2. Positive GBS test      PCN for labor/delivery  3. Chlamydia trachomatis infection in mother during second trimester of pregnancy     TOC today.    4. Vitamin D deficiency     Taking weekly vitamin D.    Preterm labor symptoms and general obstetric precautions including but not limited to vaginal bleeding, contractions, leaking of fluid and fetal movement were reviewed in detail with the patient. Please refer to After Visit Summary for other counseling recommendations.  Return in about 4 weeks (around 01/20/2017) for ROB.   Roe Coombsachelle A Ziomara Birenbaum, CNM

## 2016-12-27 LAB — GC/CHLAMYDIA PROBE AMP (~~LOC~~) NOT AT ARMC
Chlamydia: NEGATIVE
Neisseria Gonorrhea: NEGATIVE

## 2016-12-28 LAB — AFP, SERUM, OPEN SPINA BIFIDA
AFP MoM: 1.91
AFP VALUE AFPOSL: 62 ng/mL
GEST. AGE ON COLLECTION DATE: 16.6 wk
Maternal Age At EDD: 25.8 yr
OSBR Risk 1 IN: 1975
Test Results:: NEGATIVE
Weight: 203 [lb_av]

## 2016-12-31 ENCOUNTER — Other Ambulatory Visit: Payer: Self-pay | Admitting: Certified Nurse Midwife

## 2016-12-31 DIAGNOSIS — Z3480 Encounter for supervision of other normal pregnancy, unspecified trimester: Secondary | ICD-10-CM

## 2017-02-08 ENCOUNTER — Encounter (HOSPITAL_COMMUNITY): Payer: Self-pay | Admitting: Certified Nurse Midwife

## 2017-02-16 ENCOUNTER — Ambulatory Visit (HOSPITAL_COMMUNITY)
Admission: RE | Admit: 2017-02-16 | Discharge: 2017-02-16 | Disposition: A | Discharge status: Home / Self Care | Payer: Medicaid Other | Source: Ambulatory Visit | Attending: Certified Nurse Midwife | Admitting: Certified Nurse Midwife

## 2017-02-16 ENCOUNTER — Encounter (HOSPITAL_COMMUNITY): Payer: Self-pay

## 2017-02-16 ENCOUNTER — Ambulatory Visit (INDEPENDENT_AMBULATORY_CARE_PROVIDER_SITE_OTHER): Payer: Medicaid Other | Admitting: Certified Nurse Midwife

## 2017-02-16 ENCOUNTER — Other Ambulatory Visit: Payer: Self-pay | Admitting: Certified Nurse Midwife

## 2017-02-16 ENCOUNTER — Other Ambulatory Visit (HOSPITAL_COMMUNITY)
Admission: RE | Admit: 2017-02-16 | Discharge: 2017-02-16 | Disposition: A | Discharge status: Home / Self Care | Payer: Medicaid Other | Source: Ambulatory Visit | Attending: Certified Nurse Midwife | Admitting: Certified Nurse Midwife

## 2017-02-16 VITALS — BP 121/73 | HR 88 | Wt 223.0 lb

## 2017-02-16 DIAGNOSIS — Z3480 Encounter for supervision of other normal pregnancy, unspecified trimester: Secondary | ICD-10-CM

## 2017-02-16 DIAGNOSIS — Z3A23 23 weeks gestation of pregnancy: Secondary | ICD-10-CM

## 2017-02-16 DIAGNOSIS — E559 Vitamin D deficiency, unspecified: Secondary | ICD-10-CM

## 2017-02-16 DIAGNOSIS — Z3482 Encounter for supervision of other normal pregnancy, second trimester: Secondary | ICD-10-CM | POA: Insufficient documentation

## 2017-02-16 DIAGNOSIS — M549 Dorsalgia, unspecified: Secondary | ICD-10-CM

## 2017-02-16 DIAGNOSIS — Z3689 Encounter for other specified antenatal screening: Secondary | ICD-10-CM | POA: Diagnosis not present

## 2017-02-16 DIAGNOSIS — B951 Streptococcus, group B, as the cause of diseases classified elsewhere: Secondary | ICD-10-CM

## 2017-02-16 DIAGNOSIS — O9989 Other specified diseases and conditions complicating pregnancy, childbirth and the puerperium: Secondary | ICD-10-CM

## 2017-02-16 LAB — OB RESULTS CONSOLE GC/CHLAMYDIA: Gonorrhea: NEGATIVE

## 2017-02-16 MED ORDER — COMFORT FIT MATERNITY SUPP LG MISC: 1.0000 [IU] | Freq: Every day | 0 refills | Status: DC

## 2017-02-16 NOTE — Progress Notes (Signed)
Complains of: Tingling in legs and feet. White/dark spots in front of eyes. Heart pounding fast Leaking fluid x 2/week

## 2017-02-16 NOTE — Progress Notes (Signed)
   PRENATAL VISIT NOTE  Subjective:  Jenna Mcgrath is a 26 y.o. [phone] at 8533w3d being seen today for ongoing prenatal care.  She is currently monitored for the following issues for this low-risk pregnancy and has Supervision of other normal pregnancy, antepartum; Positive GBS test; Chlamydia trachomatis infection in mother during second trimester of pregnancy; and Vitamin D deficiency on her problem list.  Patient reports backache, fatigue, no bleeding, no contractions, no cramping, vaginal irritation and racing heart at times (normal pulse in office).  Contractions: Not present. Vag. Bleeding: None.  Movement: Present. Denies leaking of fluid.   The following portions of the patient's history were reviewed and updated as appropriate: allergies, current medications, past family history, past medical history, past social history, past surgical history and problem list. Problem list updated.  Objective:   Vitals:   02/16/17 1315  BP: 121/73  Pulse: 88  Weight: 223 lb (101.2 kg)    Fetal Status: Fetal Heart Rate (bpm): 142; doppler Fundal Height: 24 cm Movement: Present  Presentation: Vertex  General:  Alert, oriented and cooperative. Patient is in no acute distress.  Skin: Skin is warm and dry. No rash noted.   Cardiovascular: Normal heart rate noted  Respiratory: Normal respiratory effort, no problems with respiration noted  Abdomen: Soft, gravid, appropriate for gestational age.  Pain/Pressure: Present     Pelvic: Cervical exam performed Dilation: Closed Effacement (%): 0    Extremities: Normal range of motion.  Edema: None  Mental Status:  Normal mood and affect. Normal behavior. Normal judgment and thought content.    + nitrazine paper, negative fern, no pooling on exam  Assessment and Plan:  Pregnancy: G4P1021 at 933w3d  1. Supervision of other normal pregnancy, antepartum     Most likely discharge is urine leakage.  Kegals discussed.  - TSH Pregnancy  2. Positive GBS test   PCN for labor/delivery  3. Vitamin D deficiency     Taking weekly vitamin D  4. Back pain affecting pregnancy in second trimester     Homeopathic remedies discussed.  Support hose encouraged.  - Elastic Bandages & Supports (COMFORT FIT MATERNITY SUPP LG) MISC; 1 Units by Does not apply route daily.  Dispense: 1 each; Refill: 0  Preterm labor symptoms and general obstetric precautions including but not limited to vaginal bleeding, contractions, leaking of fluid and fetal movement were reviewed in detail with the patient. Please refer to After Visit Summary for other counseling recommendations.  Return in about 4 weeks (around 03/16/2017) for ROB, 2 hr OGTT.   Roe Coombsachelle A Talia Hoheisel, CNM

## 2017-02-17 ENCOUNTER — Other Ambulatory Visit (HOSPITAL_COMMUNITY): Payer: Self-pay | Admitting: *Deleted

## 2017-02-17 DIAGNOSIS — IMO0002 Reserved for concepts with insufficient information to code with codable children: Secondary | ICD-10-CM

## 2017-02-17 DIAGNOSIS — Z0489 Encounter for examination and observation for other specified reasons: Secondary | ICD-10-CM

## 2017-02-17 LAB — CERVICOVAGINAL ANCILLARY ONLY
Bacterial vaginitis: NEGATIVE
CANDIDA VAGINITIS: NEGATIVE
Chlamydia: NEGATIVE
Neisseria Gonorrhea: NEGATIVE
Trichomonas: NEGATIVE

## 2017-02-17 LAB — TSH PREGNANCY: TSH PREGNANCY: 1.3 u[IU]/mL (ref 0.450–4.500)

## 2017-02-20 ENCOUNTER — Telehealth: Payer: Self-pay | Admitting: *Deleted

## 2017-02-20 NOTE — Telephone Encounter (Signed)
ERROR

## 2017-03-02 ENCOUNTER — Encounter: Payer: Self-pay | Admitting: *Deleted

## 2017-03-16 ENCOUNTER — Ambulatory Visit (HOSPITAL_COMMUNITY): Payer: Medicaid Other

## 2017-03-17 ENCOUNTER — Encounter: Payer: Medicaid Other | Admitting: Certified Nurse Midwife

## 2017-03-17 ENCOUNTER — Other Ambulatory Visit: Payer: Medicaid Other

## 2017-03-20 ENCOUNTER — Ambulatory Visit (HOSPITAL_COMMUNITY): Payer: Medicaid Other

## 2017-03-23 ENCOUNTER — Ambulatory Visit (HOSPITAL_COMMUNITY): Payer: Medicaid Other

## 2017-03-23 ENCOUNTER — Encounter: Payer: Medicaid Other | Admitting: Certified Nurse Midwife

## 2017-03-23 ENCOUNTER — Other Ambulatory Visit: Payer: Medicaid Other

## 2017-03-27 ENCOUNTER — Other Ambulatory Visit: Payer: Medicaid Other

## 2017-03-27 ENCOUNTER — Encounter: Payer: Medicaid Other | Admitting: Certified Nurse Midwife

## 2017-03-27 ENCOUNTER — Ambulatory Visit (HOSPITAL_COMMUNITY): Payer: Medicaid Other | Attending: Certified Nurse Midwife

## 2017-03-27 ENCOUNTER — Encounter (HOSPITAL_COMMUNITY): Payer: Self-pay

## 2017-03-30 ENCOUNTER — Telehealth: Payer: Self-pay | Admitting: Certified Nurse Midwife

## 2017-03-30 NOTE — Telephone Encounter (Signed)
Telephone call to patient to rescheduled her missed OB appointment and 2HR GTT.  Patient is actually stranded out of town.  She is currently out of town and without transportation.  Encouraged her to call as soon as she gets back to reschedule.  Advised patient that if she plans to be out of town with family for an extended period of time that she should seek prenatal care.  Stressed the importance of the 2HR GTT testing.

## 2017-04-04 ENCOUNTER — Other Ambulatory Visit: Payer: Self-pay | Admitting: Pediatrics

## 2017-04-04 DIAGNOSIS — Z3480 Encounter for supervision of other normal pregnancy, unspecified trimester: Secondary | ICD-10-CM

## 2017-04-04 DIAGNOSIS — E559 Vitamin D deficiency, unspecified: Secondary | ICD-10-CM

## 2017-04-04 NOTE — Progress Notes (Signed)
Pt reports she is stranded out of town and is due a 2 hr gtt.  She requests orders be faxed to local hospital.  [facility] [phone]-fax  Orders entered and faxed.

## 2017-04-05 ENCOUNTER — Telehealth: Payer: Self-pay | Admitting: Pediatrics

## 2017-04-05 NOTE — Telephone Encounter (Signed)
Pt called stating lab orders not rec'd.  I refaxed them again today.

## 2017-04-18 NOTE — L&D Delivery Note (Signed)
Jenna Mcgrath is a 27 y.o. female [phone] with IUP at 6369w2d admitted for spontaneous onset of labor.  She progressed without augmentation to complete and pushed one time to deliver.  Cord clamping delayed by several minutes then clamped by CNM and cut by father.  Placenta intact and spontaneous, bleeding minimal.  No laceration.  Mom and baby stable prior to transfer to postpartum. She plans on breastfeeding. She requests nexplanon for birth control.  Delivery Note At 10:46 PM a viable female was delivered via Vaginal, Spontaneous (Presentation: LOA).  APGAR: 8, 9; weight pending.   Placenta status: Spontaneous, intact.  Cord: 3 vessels  Anesthesia:  nitrous Episiotomy: None Lacerations: None Suture Repair: n/a Est. Blood Loss (mL): 50  Mom to postpartum.  Baby to Couplet care / Skin to Skin.  Rolm BookbinderCaroline M Neill CNM 06/05/2017, 10:58 PM  Please schedule this patient for Postpartum visit in: 4 weeks with the following provider: Any provider For C/S patients schedule nurse incision check in weeks 2 weeks: no Low risk pregnancy complicated by: n/a Delivery mode:  SVD Anticipated Birth Control:  Nexplanon PP Procedures needed: n/a  Schedule Integrated BH visit: no

## 2017-05-22 ENCOUNTER — Encounter: Payer: Self-pay | Admitting: Obstetrics and Gynecology

## 2017-05-22 ENCOUNTER — Ambulatory Visit (INDEPENDENT_AMBULATORY_CARE_PROVIDER_SITE_OTHER): Payer: Medicaid Other | Admitting: Obstetrics and Gynecology

## 2017-05-22 VITALS — BP 129/82 | HR 93 | Wt 249.8 lb

## 2017-05-22 DIAGNOSIS — Z3480 Encounter for supervision of other normal pregnancy, unspecified trimester: Secondary | ICD-10-CM

## 2017-05-22 DIAGNOSIS — B951 Streptococcus, group B, as the cause of diseases classified elsewhere: Secondary | ICD-10-CM

## 2017-05-22 DIAGNOSIS — Z3483 Encounter for supervision of other normal pregnancy, third trimester: Secondary | ICD-10-CM

## 2017-05-22 NOTE — Patient Instructions (Signed)
Contraception Choices Contraception, also called birth control, refers to methods or devices that prevent pregnancy. Hormonal methods Contraceptive implant A contraceptive implant is a thin, plastic tube that contains a hormone. It is inserted into the upper part of the arm. It can remain in place for up to 3 years. Progestin-only injections Progestin-only injections are injections of progestin, a synthetic form of the hormone progesterone. They are given every 3 months by a health care provider. Birth control pills Birth control pills are pills that contain hormones that prevent pregnancy. They must be taken once a day, preferably at the same time each day. Birth control patch The birth control patch contains hormones that prevent pregnancy. It is placed on the skin and must be changed once a week for three weeks and removed on the fourth week. A prescription is needed to use this method of contraception. Vaginal ring A vaginal ring contains hormones that prevent pregnancy. It is placed in the vagina for three weeks and removed on the fourth week. After that, the process is repeated with a new ring. A prescription is needed to use this method of contraception. Emergency contraceptive Emergency contraceptives prevent pregnancy after unprotected sex. They come in pill form and can be taken up to 5 days after sex. They work best the sooner they are taken after having sex. Most emergency contraceptives are available without a prescription. This method should not be used as your only form of birth control. Barrier methods Female condom A female condom is a thin sheath that is worn over the penis during sex. Condoms keep sperm from going inside a woman's body. They can be used with a spermicide to increase their effectiveness. They should be disposed after a single use. Female condom A female condom is a soft, loose-fitting sheath that is put into the vagina before sex. The condom keeps sperm from going  inside a woman's body. They should be disposed after a single use. Diaphragm A diaphragm is a soft, dome-shaped barrier. It is inserted into the vagina before sex, along with a spermicide. The diaphragm blocks sperm from entering the uterus, and the spermicide kills sperm. A diaphragm should be left in the vagina for 6-8 hours after sex and removed within 24 hours. A diaphragm is prescribed and fitted by a health care provider. A diaphragm should be replaced every 1-2 years, after giving birth, after gaining more than 15 lb (6.8 kg), and after pelvic surgery. Cervical cap A cervical cap is a round, soft latex or plastic cup that fits over the cervix. It is inserted into the vagina before sex, along with spermicide. It blocks sperm from entering the uterus. The cap should be left in place for 6-8 hours after sex and removed within 48 hours. A cervical cap must be prescribed and fitted by a health care provider. It should be replaced every 2 years. Sponge A sponge is a soft, circular piece of polyurethane foam with spermicide on it. The sponge helps block sperm from entering the uterus, and the spermicide kills sperm. To use it, you make it wet and then insert it into the vagina. It should be inserted before sex, left in for at least 6 hours after sex, and removed and thrown away within 30 hours. Spermicides Spermicides are chemicals that kill or block sperm from entering the cervix and uterus. They can come as a cream, jelly, suppository, foam, or tablet. A spermicide should be inserted into the vagina with an applicator at least 10-15 minutes before   sex to allow time for it to work. The process must be repeated every time you have sex. Spermicides do not require a prescription. Intrauterine contraception Intrauterine device (IUD) An IUD is a T-shaped device that is put in a woman's uterus. There are two types:  Hormone IUD.This type contains progestin, a synthetic form of the hormone progesterone. This  type can stay in place for 3-5 years.  Copper IUD.This type is wrapped in copper wire. It can stay in place for 10 years.  Permanent methods of contraception Female tubal ligation In this method, a woman's fallopian tubes are sealed, tied, or blocked during surgery to prevent eggs from traveling to the uterus. Hysteroscopic sterilization In this method, a small, flexible insert is placed into each fallopian tube. The inserts cause scar tissue to form in the fallopian tubes and block them, so sperm cannot reach an egg. The procedure takes about 3 months to be effective. Another form of birth control must be used during those 3 months. Female sterilization This is a procedure to tie off the tubes that carry sperm (vasectomy). After the procedure, the man can still ejaculate fluid (semen). Natural planning methods Natural family planning In this method, a couple does not have sex on days when the woman could become pregnant. Calendar method This means keeping track of the length of each menstrual cycle, identifying the days when pregnancy can happen, and not having sex on those days. Ovulation method In this method, a couple avoids sex during ovulation. Symptothermal method This method involves not having sex during ovulation. The woman typically checks for ovulation by watching changes in her temperature and in the consistency of cervical mucus. Post-ovulation method In this method, a couple waits to have sex until after ovulation. Summary  Contraception, also called birth control, means methods or devices that prevent pregnancy.  Hormonal methods of contraception include implants, injections, pills, patches, vaginal rings, and emergency contraceptives.  Barrier methods of contraception can include female condoms, female condoms, diaphragms, cervical caps, sponges, and spermicides.  There are two types of IUDs (intrauterine devices). An IUD can be put in a woman's uterus to prevent pregnancy  for 3-5 years.  Permanent sterilization can be done through a procedure for males, females, or both.  Natural family planning methods involve not having sex on days when the woman could become pregnant. This information is not intended to replace advice given to you by your health care provider. Make sure you discuss any questions you have with your health care provider. Document Released: 04/04/2005 Document Revised: 05/07/2016 Document Reviewed: 05/07/2016 Elsevier Interactive Patient Education  2018 [employer]  

## 2017-05-22 NOTE — Progress Notes (Signed)
   PRENATAL VISIT NOTE  Subjective:  Jenna Mcgrath is a 27 y.o. [phone] at 5234w2d being seen today for ongoing prenatal care.  She is currently monitored for the following issues for this low-risk pregnancy and has Supervision of other normal pregnancy, antepartum; Positive GBS test; Chlamydia trachomatis infection in mother during second trimester of pregnancy; and Vitamin D deficiency on their problem list.  Patient reports no complaints.  Contractions: Not present. Vag. Bleeding: Scant.  Movement: Present. Denies leaking of fluid.   The following portions of the patient's history were reviewed and updated as appropriate: allergies, current medications, past family history, past medical history, past social history, past surgical history and problem list. Problem list updated.  Objective:   Vitals:   05/22/17 1516  BP: 129/82  Pulse: 93  Weight: 249 lb 12.8 oz (113.3 kg)    Fetal Status: Fetal Heart Rate (bpm): 141 Fundal Height: 37 cm Movement: Present     General:  Alert, oriented and cooperative. Patient is in no acute distress.  Skin: Skin is warm and dry. No rash noted.   Cardiovascular: Normal heart rate noted  Respiratory: Normal respiratory effort, no problems with respiration noted  Abdomen: Soft, gravid, appropriate for gestational age.  Pain/Pressure: Present     Pelvic: Cervical exam deferred        Extremities: Normal range of motion.  Edema: None  Mental Status:  Normal mood and affect. Normal behavior. Normal judgment and thought content.   Assessment and Plan:  Pregnancy: G4P1021 at 1034w2d  1. Supervision of other normal pregnancy, antepartum Patient is doing well without complaints Patient has not been seen since 23 weeks as she has been out of town. She was seen on 1/25 for an Ob visit where 1 hour glucola was normal Follow up ultrasound ordered today Patient remains undecided on contraception - US MFM OB FOLLOW UP; Future  2. Positive GBS test Bacteriuria  early in pregnancy (negative on 1/250 Will provide prophylaxis in labor  Term labor symptoms and general obstetric precautions including but not limited to vaginal bleeding, contractions, leaking of fluid and fetal movement were reviewed in detail with the patient. Please refer to After Visit Summary for other counseling recommendations.  Return in about 1 week (around 05/29/2017) for ROB.   Catalina AntiguaPeggy Elmarie Devlin, MD

## 2017-05-29 ENCOUNTER — Ambulatory Visit (HOSPITAL_COMMUNITY)
Admission: RE | Admit: 2017-05-29 | Discharge: 2017-05-29 | Disposition: A | Discharge status: Home / Self Care | Payer: Medicaid Other | Source: Ambulatory Visit | Attending: Obstetrics and Gynecology | Admitting: Obstetrics and Gynecology

## 2017-05-29 ENCOUNTER — Ambulatory Visit (INDEPENDENT_AMBULATORY_CARE_PROVIDER_SITE_OTHER): Payer: Medicaid Other | Admitting: Obstetrics & Gynecology

## 2017-05-29 ENCOUNTER — Other Ambulatory Visit: Payer: Self-pay | Admitting: Obstetrics and Gynecology

## 2017-05-29 VITALS — BP 116/77 | HR 101 | Wt 252.5 lb

## 2017-05-29 DIAGNOSIS — O99213 Obesity complicating pregnancy, third trimester: Secondary | ICD-10-CM | POA: Diagnosis not present

## 2017-05-29 DIAGNOSIS — O3663X0 Maternal care for excessive fetal growth, third trimester, not applicable or unspecified: Secondary | ICD-10-CM | POA: Insufficient documentation

## 2017-05-29 DIAGNOSIS — Z0489 Encounter for examination and observation for other specified reasons: Secondary | ICD-10-CM

## 2017-05-29 DIAGNOSIS — Z3A38 38 weeks gestation of pregnancy: Secondary | ICD-10-CM | POA: Insufficient documentation

## 2017-05-29 DIAGNOSIS — E669 Obesity, unspecified: Secondary | ICD-10-CM | POA: Insufficient documentation

## 2017-05-29 DIAGNOSIS — Z3480 Encounter for supervision of other normal pregnancy, unspecified trimester: Secondary | ICD-10-CM

## 2017-05-29 DIAGNOSIS — Z23 Encounter for immunization: Secondary | ICD-10-CM

## 2017-05-29 DIAGNOSIS — IMO0002 Reserved for concepts with insufficient information to code with codable children: Secondary | ICD-10-CM

## 2017-05-29 DIAGNOSIS — Z3483 Encounter for supervision of other normal pregnancy, third trimester: Secondary | ICD-10-CM

## 2017-05-29 DIAGNOSIS — Z362 Encounter for other antenatal screening follow-up: Secondary | ICD-10-CM | POA: Diagnosis present

## 2017-05-29 NOTE — Progress Notes (Signed)
PRENATAL VISIT NOTE  Subjective:  Jenna Mcgrath is a 27 y.o. [phone] at [redacted]w[redacted]d being seen today for ongoing prenatal care.  She is currently monitored for the following issues for this low-risk pregnancy and has Supervision of other normal pregnancy, antepartum; Positive GBS test; Chlamydia trachomatis infection in mother during second trimester of pregnancy; Vitamin D deficiency; and LGA (large for gestational age) fetus affecting management of mother, third trimester on their problem list.  Patient reports no complaints.  Contractions: Irregular. Vag. Bleeding: None.  Movement: Present. Denies leaking of fluid.   The following portions of the patient's history were reviewed and updated as appropriate: allergies, current medications, past family history, past medical history, past social history, past surgical history and problem list. Problem list updated.  Objective:   Vitals:   05/29/17 1000  BP: 116/77  Pulse: (!) 101  Weight: 252 lb 8 oz (114.5 kg)    Fetal Status: Fetal Heart Rate (bpm): 140 Fundal Height: 39 cm Movement: Present  Presentation: Vertex  General:  Alert, oriented and cooperative. Patient is in no acute distress.  Skin: Skin is warm and dry. No rash noted.   Cardiovascular: Normal heart rate noted  Respiratory: Normal respiratory effort, no problems with respiration noted  Abdomen: Soft, gravid, appropriate for gestational age.  Pain/Pressure: Present     Pelvic: Cervical exam performed Dilation: 1 Effacement (%): Thick Station: Ballotable  Extremities: Normal range of motion.  Edema: Trace  Mental Status:  Normal mood and affect. Normal behavior. Normal judgment and thought content.   [place] Mfm Ob Follow Up  Result Date: 05/29/2017 ----------------------------------------------------------------------  OBSTETRICS REPORT                      (Signed Final 05/29/2017 09:41 am) ---------------------------------------------------------------------- Patient Info  ID #:        [id number]                          D.O.B.:  [birth year] (26 yrs)  Name:       Jenna Mcgrath                    Visit Date: 05/29/2017 08:42 am ---------------------------------------------------------------------- Performed By  Performed By:     Jenna Mcgrath          Ref. Address:     [address]                                                             [place]  [address]  Attending:        Charlsie Merles MD         Location:         [facility]  Referred By:      Roe Coombs CNM ---------------------------------------------------------------------- Orders   #  Description                                 Code   1  [place] MFM OB FOLLOW UP                         [id number]  ----------------------------------------------------------------------   #  Ordered By               Order #        Accession #    Episode #   1  Jenna Mcgrath           [id number]      [id number]     [id number]  ---------------------------------------------------------------------- Indications   [redacted] weeks gestation of pregnancy                Z3A.38   Antenatal follow-up for nonvisualized fetal    Z36.2   anatomy  ---------------------------------------------------------------------- OB History  Blood Type:            Height:  5'7"   Weight (lb):  222       BMI:  34.77  Gravidity:    4         Term:   1         SAB:   1  TOP:          1        Living:  1 ---------------------------------------------------------------------- Fetal Evaluation  Num Of Fetuses:     1  Fetal Heart         130  Rate(bpm):  Cardiac Activity:   Observed  Presentation:       Cephalic  Placenta:           Posterior, above cervical os  P. Cord Insertion:  Visualized, central  Amniotic Fluid  AFI FV:      Subjectively within normal limits  AFI Sum(cm)     %Tile       Largest Pocket(cm)  14.38            56          5.62  RUQ(cm)       RLQ(cm)       LUQ(cm)        LLQ(cm)  5.62          2.11          2.26           4.39 ---------------------------------------------------------------------- Biometry  BPD:      92.2  mm     G. Age:  37w 3d         53  %    CI:        76.94   %    70 - 86  FL/HC:      22.0   %    20.9 - 22.7  HC:      332.9  mm     G. Age:  38w 0d         26  %    HC/AC:      0.92        0.92 - 1.05  AC:      362.1  mm     G. Age:  40w 1d         97  %    FL/BPD:     79.5   %    71 - 87  FL:       73.3  mm     G. Age:  37w 4d         35  %    FL/AC:      20.2   %    20 - 24  Est. FW:    3652  gm      8 lb 1 oz   > 90  % ---------------------------------------------------------------------- Gestational Age  LMP:           37w 4d        Date:  09/08/16                 EDD:   06/15/17  U/S Today:     38w 2d                                        EDD:   06/10/17  Best:          38w 2d     Det. By:  U/S C R L  (11/28/16)    EDD:   06/10/17 ---------------------------------------------------------------------- Anatomy  Cranium:               Appears normal         Aortic Arch:            Previously seen  Cavum:                 Appears normal         Ductal Arch:            Appears normal  Ventricles:            Previously seen        Diaphragm:              Previously seen  Choroid Plexus:        Previously seen        Stomach:                Appears normal, left                                                                        sided  Cerebellum:            Previously seen        Abdomen:                Appears normal  Posterior Fossa:  Previously seen        Abdominal Wall:         Not well visualized  Nuchal Fold:           Not applicable (>20    Cord Vessels:           Previously seen                         wks GA)  Face:                  Orbits and profile     Kidneys:                Appear normal                          previously seen  Lips:                  Previously seen        Bladder:                Appears normal  Thoracic:              Appears normal         Spine:                  Previously seen  Heart:                 Appears normal         Upper Extremities:      Previously seen                         (4CH, axis, and situs  RVOT:                  Appears normal         Lower Extremities:      Previously seen  LVOT:                  Appears normal  Other:  Fetus appears to be a female. Heels and  LT 5th previously digit          visualized. Nasal bone previously visualized. Technically difficult due          to maternal habitus and fetal position. ---------------------------------------------------------------------- Impression  Singleton intrauterine pregnancy at 38+2 weeks with obesity  here for completion of anatomic survey  Interval review of the anatomy shows no sonographic  markers for aneuploidy or structural anomalies  All relevant fetal anatomy has been visualized  Amniotic fluid volume is normal  Estimated fetal weight shows growth at >90th percentile ---------------------------------------------------------------------- Recommendations  Follow-up ultrasounds as clinically indicated. ----------------------------------------------------------------------                 Jenna Merles, MD Electronically Signed Final Report   05/29/2017 09:41 am ----------------------------------------------------------------------   Assessment and Plan:  Pregnancy: [id number] at [redacted]w[redacted]d  1. Need for diphtheria-tetanus-pertussis (Tdap) vaccine - Tdap vaccine greater than or equal to 7yo IM  2. Supervision of other normal pregnancy, antepartum EFW >90%, patient is aware.  Term labor symptoms and general obstetric precautions including but not limited to vaginal bleeding, contractions, leaking of fluid and fetal movement were reviewed in detail with the patient. Please refer to After Visit Summary for other counseling  recommendations.  Return in about 1 week (around 06/05/2017) for OB  Visit  2 weeks: OB visit and NST.   Jaynie CollinsUgonna Chyanna Flock, MD

## 2017-05-29 NOTE — Patient Instructions (Signed)
Return to clinic for any scheduled appointments or obstetric concerns, or go to MAU for evaluation  

## 2017-06-05 ENCOUNTER — Inpatient Hospital Stay (HOSPITAL_COMMUNITY)
Admission: AD | Admit: 2017-06-05 | Discharge: 2017-06-07 | DRG: 807 | Disposition: A | Discharge status: Home / Self Care | Payer: Medicaid Other | Source: Ambulatory Visit | Attending: Obstetrics and Gynecology | Admitting: Obstetrics and Gynecology

## 2017-06-05 ENCOUNTER — Encounter (HOSPITAL_COMMUNITY): Payer: Self-pay | Admitting: *Deleted

## 2017-06-05 DIAGNOSIS — Z87891 Personal history of nicotine dependence: Secondary | ICD-10-CM | POA: Diagnosis not present

## 2017-06-05 DIAGNOSIS — R030 Elevated blood-pressure reading, without diagnosis of hypertension: Secondary | ICD-10-CM | POA: Diagnosis present

## 2017-06-05 DIAGNOSIS — O99824 Streptococcus B carrier state complicating childbirth: Principal | ICD-10-CM | POA: Diagnosis present

## 2017-06-05 DIAGNOSIS — B951 Streptococcus, group B, as the cause of diseases classified elsewhere: Secondary | ICD-10-CM | POA: Diagnosis present

## 2017-06-05 DIAGNOSIS — Z3A39 39 weeks gestation of pregnancy: Secondary | ICD-10-CM

## 2017-06-05 DIAGNOSIS — O9089 Other complications of the puerperium, not elsewhere classified: Secondary | ICD-10-CM | POA: Diagnosis present

## 2017-06-05 DIAGNOSIS — Z3483 Encounter for supervision of other normal pregnancy, third trimester: Secondary | ICD-10-CM | POA: Diagnosis present

## 2017-06-05 DIAGNOSIS — O3663X0 Maternal care for excessive fetal growth, third trimester, not applicable or unspecified: Secondary | ICD-10-CM | POA: Diagnosis present

## 2017-06-05 DIAGNOSIS — Z3480 Encounter for supervision of other normal pregnancy, unspecified trimester: Secondary | ICD-10-CM

## 2017-06-05 LAB — TYPE AND SCREEN
ABO/RH(D): A POS
Antibody Screen: NEGATIVE

## 2017-06-05 LAB — PROTEIN / CREATININE RATIO, URINE
Creatinine, Urine: 80 mg/dL
PROTEIN CREATININE RATIO: 0.15 mg/mg{creat} (ref 0.00–0.15)
Total Protein, Urine: 12 mg/dL

## 2017-06-05 LAB — CBC
HEMATOCRIT: 39.1 % (ref 36.0–46.0)
HEMOGLOBIN: 12.8 g/dL (ref 12.0–15.0)
MCH: 28.8 pg (ref 26.0–34.0)
MCHC: 32.7 g/dL (ref 30.0–36.0)
MCV: 88.1 fL (ref 78.0–100.0)
Platelets: 279 10*3/uL (ref 150–400)
RBC: 4.44 MIL/uL (ref 3.87–5.11)
RDW: 15.1 % (ref 11.5–15.5)
WBC: 11.6 10*3/uL — ABNORMAL HIGH (ref 4.0–10.5)

## 2017-06-05 LAB — URINALYSIS, ROUTINE W REFLEX MICROSCOPIC
Bilirubin Urine: NEGATIVE
GLUCOSE, UA: 150 mg/dL — AB
Hgb urine dipstick: NEGATIVE
KETONES UR: NEGATIVE mg/dL
LEUKOCYTES UA: NEGATIVE
NITRITE: NEGATIVE
PH: 6 (ref 5.0–8.0)
Protein, ur: NEGATIVE mg/dL
Specific Gravity, Urine: 1.011 (ref 1.005–1.030)

## 2017-06-05 LAB — COMPREHENSIVE METABOLIC PANEL
ALBUMIN: 3 g/dL — AB (ref 3.5–5.0)
ALT: 14 U/L (ref 14–54)
AST: 23 U/L (ref 15–41)
Alkaline Phosphatase: 237 U/L — ABNORMAL HIGH (ref 38–126)
Anion gap: 8 (ref 5–15)
BILIRUBIN TOTAL: 0.3 mg/dL (ref 0.3–1.2)
BUN: 7 mg/dL (ref 6–20)
CHLORIDE: 103 mmol/L (ref 101–111)
CO2: 23 mmol/L (ref 22–32)
Calcium: 9.1 mg/dL (ref 8.9–10.3)
Creatinine, Ser: 0.58 mg/dL (ref 0.44–1.00)
GFR calc Af Amer: >60 mL/min (ref >60)
GFR calc non Af Amer: >60 mL/min (ref >60)
GLUCOSE: 74 mg/dL (ref 65–99)
POTASSIUM: 3.8 mmol/L (ref 3.5–5.1)
SODIUM: 134 mmol/L — AB (ref 135–145)
TOTAL PROTEIN: 7.3 g/dL (ref 6.5–8.1)

## 2017-06-05 LAB — ABO/RH: ABO/RH(D): A POS

## 2017-06-05 MED ORDER — ACETAMINOPHEN 325 MG PO TABS: 650.0000 mg | ORAL_TABLET | ORAL | Status: DC | PRN

## 2017-06-05 MED ORDER — PENICILLIN G POT IN DEXTROSE 60000 UNIT/ML IV SOLN
3.0000 10*6.[IU] | INTRAVENOUS | Status: DC
  Filled 2017-06-05 (×4): qty 50

## 2017-06-05 MED ORDER — ONDANSETRON HCL 4 MG/2ML IJ SOLN: 4.0000 mg | Freq: Four times a day (QID) | INTRAMUSCULAR | Status: DC | PRN

## 2017-06-05 MED ORDER — SODIUM CHLORIDE 0.9 % IV SOLN
5.0000 10*6.[IU] | Freq: Once | INTRAVENOUS | Status: AC
  Administered 2017-06-05: 5 10*6.[IU] via INTRAVENOUS
  Filled 2017-06-05: qty 5

## 2017-06-05 MED ORDER — OXYTOCIN 40 UNITS IN LACTATED RINGERS INFUSION - SIMPLE MED
2.5000 [IU]/h | INTRAVENOUS | Status: DC
  Filled 2017-06-05: qty 1000

## 2017-06-05 MED ORDER — OXYCODONE-ACETAMINOPHEN 5-325 MG PO TABS: 2.0000 | ORAL_TABLET | ORAL | Status: DC | PRN

## 2017-06-05 MED ORDER — LIDOCAINE HCL (PF) 1 % IJ SOLN
30.0000 mL | INTRAMUSCULAR | Status: DC | PRN
  Filled 2017-06-05: qty 30

## 2017-06-05 MED ORDER — LACTATED RINGERS IV SOLN
INTRAVENOUS | Status: DC
  Administered 2017-06-05: 19:00:00 via INTRAVENOUS

## 2017-06-05 MED ORDER — OXYTOCIN BOLUS FROM INFUSION
500.0000 mL | Freq: Once | INTRAVENOUS | Status: AC
  Administered 2017-06-05: 500 mL via INTRAVENOUS

## 2017-06-05 MED ORDER — SOD CITRATE-CITRIC ACID 500-334 MG/5ML PO SOLN: 30.0000 mL | ORAL | Status: DC | PRN

## 2017-06-05 MED ORDER — OXYCODONE-ACETAMINOPHEN 5-325 MG PO TABS: 1.0000 | ORAL_TABLET | ORAL | Status: DC | PRN

## 2017-06-05 MED ORDER — FENTANYL CITRATE (PF) 100 MCG/2ML IJ SOLN
100.0000 ug | INTRAMUSCULAR | Status: DC | PRN
  Filled 2017-06-05: qty 2

## 2017-06-05 MED ORDER — LACTATED RINGERS IV SOLN: 500.0000 mL | INTRAVENOUS | Status: DC | PRN

## 2017-06-05 NOTE — MAU Note (Signed)
Patient presents with contractions overnight that are now 2-714min apart.  Patient rating pain 7/10.  Also states "I may have a little leak" as she describes leaking some clear fluid yesterday, but nothing today.  Reports good fetal movement.

## 2017-06-05 NOTE — Anesthesia Pain Management Evaluation Note (Signed)
  [facility] Pain Management Visit Note  Patient: Ralph DowdyKani Fogg, 27 y.o., female  "Hello I am a member of the anesthesia team at [facility]. We have an anesthesia team available at all times to provide care throughout the hospital, including epidural management and anesthesia for C-section. I don't know your plan for the delivery whether it a natural birth, water birth, IV sedation, nitrous supplementation, doula or epidural, but we want to meet your pain goals."   1.Was your pain managed to your expectations on prior hospitalizations?   Yes   2.What is your expectation for pain management during this hospitalization?     Nitrous Oxide  3.How can we help you reach that goal? Nursing measures.  Record the patient's initial score and the patient's pain goal.   Pain: 8  Pain Goal: 8 [facility] wants you to be able to say your pain was always managed very well.  Quamaine Webb 06/05/2017

## 2017-06-05 NOTE — Progress Notes (Signed)
Labor Progress Note Jenna DowdyKani Mcgrath is a 27 y.o. [phone] at 3319w2d presented for spontaneous onset of labor  S:  Patient comfortable, reporting some contractions worse than others.   O:  BP 134/81 (BP Location: Left Arm)   Pulse 89   Temp 98 F (36.7 C) (Oral)   Resp 20   Ht 5\' 7"  (1.702 m)   Wt 255 lb 4 oz (115.8 kg)   LMP 09/08/2016   SpO2 100%   BMI 39.98 kg/m   Fetal Tracing:  Baseline: 130 Variability: moderate Accels: 15x15  Decels: none  Toco: 3-4   CVE: Dilation: 4 Effacement (%): 80 Presentation: Vertex Exam by:: Suzette BattiestVeronica, CNM   A&P: 27 y.o. [id number] 7619w2d SOL #Labor: Progressing well. Continue expectant management. Plan to recheck in 2-3 hours, possibly AROM then. #Pain: per patient request #FWB: Cat 1 #GBS positive  Rolm Bookbinderaroline M Ingra Rother, CNM 8:16 PM

## 2017-06-05 NOTE — Progress Notes (Signed)
Labor Progress Note Jenna Mcgrath is a 27 y.o. [phone] at 6587w2d presented for spontaneous onset of labor  S:  Patient reporting intermittent pressure in vagina  O:  BP (!) 134/92   Pulse 92   Temp 98 F (36.7 C) (Oral)   Resp 20   Ht 5\' 7"  (1.702 m)   Wt 255 lb 4 oz (115.8 kg)   LMP 09/08/2016   SpO2 100%   BMI 39.98 kg/m   Fetal Tracing:  Baseline: 125 Variability: moderate Accels: 15x15  Decels: variable  Toco: 2-5   CVE: Dilation: 6 Effacement (%): 90 Station: +1 Presentation: Vertex Exam by:: C Jensen Cheramie CNM   A&P: 27 y.o. [id number] 2687w2d SOL #Labor: Progressing well. Discussed with patient risks and benefits of AROM for labor augmentation. Patient desires AROM because she reports rapid delivery after AROM with first baby. AROM with small amount of clear fluid.  #Pain: nitrous #FWB: Cat 1 #GBS positive  [name], CNM 10:03 PM

## 2017-06-05 NOTE — H&P (Signed)
LABOR AND DELIVERY ADMISSION HISTORY AND PHYSICAL NOTE  Harrison Paulson is a 27 y.o. female [phone] with IUP at [redacted]w[redacted]d by LMP presenting for SOL.  She reports positive fetal movement. She denies leakage of fluid or vaginal bleeding. In MAU she also had elevation of BP, she reports that she has never has BP issues during pregnancy or prior. PreE labs sent in MAU.   Prenatal History/Complications: PNC at [facility] [place] Pregnancy complications:  -LGA infant: EFW >90%, AC 97% on 2/11 at 38w -Hypertension in MAU, possible response to labor and pain- labs pending  Past Medical History: Past Medical History:  Diagnosis Date  . Medical history non-contributory     Past Surgical History: Past Surgical History:  Procedure Laterality Date  . TONSILLECTOMY AND ADENOIDECTOMY    . VULVA SURGERY      Obstetrical History: OB History    Gravida Para Term Preterm AB Living   4 1 1   2 1    SAB TAB Ectopic Multiple Live Births   1 1     1       Social History: Social History   Socioeconomic History  . Marital status: Single    Spouse name: None  . Number of children: None  . Years of education: None  . Highest education level: None  Social Needs  . Financial resource strain: None  . Food insecurity - worry: None  . Food insecurity - inability: None  . Transportation needs - medical: None  . Transportation needs - non-medical: None  Occupational History  . None  Tobacco Use  . Smoking status: Former Smoker    Packs/day: 0.50    Types: Cigarettes    Last attempt to quit: 10/07/2016    Years since quitting: 0.6  . Smokeless tobacco: Never Used  . Tobacco comment: heavy smoker before pregnancy, not much since confirm pregnanacy  Substance and Sexual Activity  . Alcohol use: No    Comment: Socially  . Drug use: No  . Sexual activity: Yes    Birth control/protection: None  Other Topics Concern  . None  Social History Narrative  . None    Family History: Family History  Problem  Relation Age of Onset  . Cancer Maternal Grandmother   . Diabetes Maternal Grandmother   . Diabetes Paternal Grandmother   . Colon cancer Paternal Grandfather     Allergies: No Known Allergies  Medications Prior to Admission  Medication Sig Dispense Refill Last Dose  . Elastic Bandages & Supports (COMFORT FIT MATERNITY SUPP LG) MISC 1 Units by Does not apply route daily. (Patient not taking: Reported on 05/22/2017) 1 each 0 Not Taking  . Prenat-FeAsp-Meth-FA-DHA w/o A (PRENATE PIXIE) 10-0.6-0.4-200 MG CAPS Take 1 tablet by mouth daily. 30 capsule 12 Taking  . Vitamin D, Ergocalciferol, (DRISDOL) 50000 units CAPS capsule Take 1 capsule (50,000 Units total) by mouth every 7 (seven) days. 30 capsule 2 Taking    Review of Systems  All systems reviewed and negative except as stated in HPI  Physical Exam Blood pressure 133/76, pulse 93, temperature 98.1 F (36.7 C), temperature source Oral, resp. rate 18, weight 255 lb 4 oz (115.8 kg), last menstrual period 09/08/2016, SpO2 100 %. General appearance: alert, cooperative and no distress Lungs: clear to auscultation bilaterally Heart: regular rate and rhythm Abdomen: soft, non-tender; bowel sounds normal Extremities: No calf swelling or tenderness Presentation: cephalic by cervical exam Fetal monitoring: 130/ minimal-moderate variability/ +accels/ no decelerations  Uterine activity: 2-37minutes/ moderate by palpation  Dilation: 4 Effacement (%): 80 Exam by:: Suzette BattiestVeronica, CNM Membranes: BBOW   Prenatal labs: ABO, Rh: A/Positive/-- (08/13 0952) Antibody: Negative (08/13 0952) Rubella: 1.40 (08/13 0952) RPR: Non Reactive (08/13 0952)  HBsAg: Negative (08/13 0952)  HIV: Non Reactive (08/13 0952)  GC/Chlamydia: TOC Negative (02/16/17), +CT @[facility]  (11/28/16) GBS:   GBS Bacteruria (8/13) 1 hr Glucola: 129 @ [facility] Genetic screening:  normal Anatomy US: normal  Clinic  [facility] Prenatal Labs  Dating  LMP Blood type: A/Positive/-- (08/13 0952)    Genetic Screen   AFP:neg       NIPS:Mat21:normal Antibody:Negative (08/13 16100952)  Anatomic US Normal Rubella: 1.40 (08/13 0952)  GTT  Third trimester: Normal 1 hr GTT 129 (05/12/17) at [facility] - seen on [facility]Everywhere RPR: Non Reactive (08/13 0952)   Flu vaccine Declined 02/16/17 HBsAg: Negative (08/13 0952)   TDaP vaccine 05/29/17                                        HIV: Non Reactive (08/13 0952) NR  Baby Food   Breast                                            GBS: (For PCN allergy, check sensitivities)  Contraception  Nexplanon Pap:11/28/16: neg  Circumcision N/a female   Pediatrician  [facility]   Support Person   FOB CF:neg  Prenatal Classes  yes Hgb electrophoresis:normal    Prenatal Transfer Tool  Maternal Diabetes: No Genetic Screening: Normal Maternal Ultrasounds/Referrals: Normal Fetal Ultrasounds or other Referrals:  None Maternal Substance Abuse:  No Significant Maternal Medications:  None Significant Maternal Lab Results: Lab values include: Group B Strep positive  Results for orders placed or performed during the hospital encounter of 06/05/17 (from the past 24 hour(s))  Urinalysis, Routine w reflex microscopic   Collection Time: 06/05/17  5:20 PM  Result Value Ref Range   Color, Urine YELLOW YELLOW   APPearance HAZY (A) CLEAR   Specific Gravity, Urine 1.011 1.005 - 1.030   pH 6.0 5.0 - 8.0   Glucose, UA 150 (A) NEGATIVE mg/dL   Hgb urine dipstick NEGATIVE NEGATIVE   Bilirubin Urine NEGATIVE NEGATIVE   Ketones, ur NEGATIVE NEGATIVE mg/dL   Protein, ur NEGATIVE NEGATIVE mg/dL   Nitrite NEGATIVE NEGATIVE   Leukocytes, UA NEGATIVE NEGATIVE    Patient Active Problem List   Diagnosis Date Noted  . LGA (large for gestational age) fetus affecting management of mother, third trimester 05/29/2017  . Positive GBS test 12/05/2016  . Chlamydia trachomatis infection in mother during second trimester of pregnancy 12/05/2016  . Vitamin D deficiency 12/05/2016  .  Supervision of other normal pregnancy, antepartum 11/28/2016    Assessment: Ralph DowdyKani Nicol is a 27 y.o. G4P1021 at 4138w2d here for SOL. Contractions started last night around 2100.   #Labor: Expectant management. Possible AROM  #Pain: Patient desired natural delivery w/o epidural. Nitrous and IV pain medication ordered PRN.  #FWB: Cat 1 #ID:  GBS pos #MOF: Breast #MOC:Nexplanon #Circ:  n/a  Sharyon CableRogers, Red Mandt C, CNM 06/05/2017, 6:37 PM

## 2017-06-06 ENCOUNTER — Encounter (HOSPITAL_COMMUNITY): Payer: Self-pay | Admitting: *Deleted

## 2017-06-06 ENCOUNTER — Encounter: Payer: Medicaid Other | Admitting: Obstetrics & Gynecology

## 2017-06-06 LAB — CBC
HEMATOCRIT: 34.6 % — AB (ref 36.0–46.0)
Hemoglobin: 11.5 g/dL — ABNORMAL LOW (ref 12.0–15.0)
MCH: 28.8 pg (ref 26.0–34.0)
MCHC: 33.2 g/dL (ref 30.0–36.0)
MCV: 86.5 fL (ref 78.0–100.0)
PLATELETS: 256 10*3/uL (ref 150–400)
RBC: 4 MIL/uL (ref 3.87–5.11)
RDW: 15.1 % (ref 11.5–15.5)
WBC: 12.9 10*3/uL — AB (ref 4.0–10.5)

## 2017-06-06 LAB — RPR: RPR Ser Ql: NONREACTIVE

## 2017-06-06 MED ORDER — DIPHENHYDRAMINE HCL 25 MG PO CAPS: 25.0000 mg | ORAL_CAPSULE | Freq: Four times a day (QID) | ORAL | Status: DC | PRN

## 2017-06-06 MED ORDER — IBUPROFEN 600 MG PO TABS
600.0000 mg | ORAL_TABLET | Freq: Four times a day (QID) | ORAL | Status: DC
Start: 2017-06-06 — End: 2017-06-07
  Administered 2017-06-06 – 2017-06-07 (×6): 600 mg via ORAL
  Filled 2017-06-06 (×6): qty 1

## 2017-06-06 MED ORDER — ONDANSETRON HCL 4 MG PO TABS: 4.0000 mg | ORAL_TABLET | ORAL | Status: DC | PRN

## 2017-06-06 MED ORDER — ZOLPIDEM TARTRATE 5 MG PO TABS: 5.0000 mg | ORAL_TABLET | Freq: Every evening | ORAL | Status: DC | PRN

## 2017-06-06 MED ORDER — COCONUT OIL OIL
1.0000 "application " | TOPICAL_OIL | Status: DC | PRN
  Administered 2017-06-07: 1 via TOPICAL
  Filled 2017-06-06: qty 120

## 2017-06-06 MED ORDER — SENNOSIDES-DOCUSATE SODIUM 8.6-50 MG PO TABS
2.0000 | ORAL_TABLET | ORAL | Status: DC
Start: 2017-06-06 — End: 2017-06-07
  Administered 2017-06-07: 2 via ORAL
  Filled 2017-06-06: qty 2

## 2017-06-06 MED ORDER — ONDANSETRON HCL 4 MG/2ML IJ SOLN: 4.0000 mg | INTRAMUSCULAR | Status: DC | PRN

## 2017-06-06 MED ORDER — SIMETHICONE 80 MG PO CHEW: 80.0000 mg | CHEWABLE_TABLET | ORAL | Status: DC | PRN

## 2017-06-06 MED ORDER — BENZOCAINE-MENTHOL 20-0.5 % EX AERO
1.0000 "application " | INHALATION_SPRAY | CUTANEOUS | Status: DC | PRN
  Administered 2017-06-06: 1 via TOPICAL
  Filled 2017-06-06 (×2): qty 56

## 2017-06-06 MED ORDER — WITCH HAZEL-GLYCERIN EX PADS: 1.0000 "application " | MEDICATED_PAD | CUTANEOUS | Status: DC | PRN

## 2017-06-06 MED ORDER — DIBUCAINE 1 % RE OINT: 1.0000 "application " | TOPICAL_OINTMENT | RECTAL | Status: DC | PRN

## 2017-06-06 MED ORDER — PRENATAL MULTIVITAMIN CH
1.0000 | ORAL_TABLET | Freq: Every day | ORAL | Status: DC
  Administered 2017-06-06 – 2017-06-07 (×2): 1 via ORAL
  Filled 2017-06-06 (×2): qty 1

## 2017-06-06 MED ORDER — ACETAMINOPHEN 325 MG PO TABS
650.0000 mg | ORAL_TABLET | ORAL | Status: DC | PRN
  Administered 2017-06-06 (×2): 650 mg via ORAL
  Filled 2017-06-06 (×3): qty 2

## 2017-06-06 MED ORDER — TETANUS-DIPHTH-ACELL PERTUSSIS 5-2.5-18.5 LF-MCG/0.5 IM SUSP: 0.5000 mL | Freq: Once | INTRAMUSCULAR | Status: DC

## 2017-06-06 NOTE — Progress Notes (Signed)
Post Partum Day 1 Subjective: no complaints and tolerating PO  Objective: Blood pressure (!) 126/7, pulse 68, temperature 97.8 F (36.6 C), temperature source Oral, resp. rate 18, height 5\' 7"  (1.702 m), weight 115.8 kg (255 lb 4 oz), last menstrual period 09/08/2016, SpO2 100 %, unknown if currently breastfeeding.  Physical Exam:  General: alert, cooperative and appears stated age Lochia: appropriate Uterine Fundus: firm Incision: n/a DVT Evaluation: No evidence of DVT seen on physical exam. No cords or calf tenderness.  Recent Labs    06/05/17 1821 06/06/17 0529  HGB 12.8 11.5*  HCT 39.1 34.6*    Assessment/Plan: Plan for discharge tomorrow, Breastfeeding and Lactation consult   Assisted mom with latch (was swaddled) STS and tummy to tummy. Mom appreciative.    LOS: 1 day   Loni MuseKate Hanley Woerner 06/06/2017, 6:52 AM

## 2017-06-06 NOTE — Lactation Note (Signed)
This note was copied from a baby's chart. Lactation Consultation Note  Patient Name: Girl Ralph DowdyKani Prickett WGNFA'OToday's Date: 06/06/2017 Reason for consult: Initial assessment;Term  22 hours female who is being exclusively BF by her mother, she's a P2. Mom complained of sore nipples but they look intact upon examination, she's probably experiencing some transient soreness. It does not interfere with baby's feeding, she's still able to nurse her baby.  Baby latched on well, heard some swallows and mom stated that feedings at the breast felt more comfortable. Baby was still nursing when exiting the room.  Mom has history of Vit. "D" deficiency, recommended supplementing baby with a multivitamin if fully BF. Mom plans to BF baby on the long term like she did with her first one, she BF for 6 months.  Encouraged mom to put baby STS during feedings and to feed at least 8-12 times/day on feeding cues instead of on schedule. Reviewed BF brochure, BF resources and feeding diary, mom is aware of LC services and will call PRN.    Maternal Data Formula Feeding for Exclusion: No Has patient been taught Hand Expression?: Yes Does the patient have breastfeeding experience prior to this delivery?: Yes  Feeding Feeding Type: Breast Fed Length of feed: 25 min(baby was still nursing when exiting the room)  LATCH Score Latch: Grasps breast easily, tongue down, lips flanged, rhythmical sucking.  Audible Swallowing: A few with stimulation  Type of Nipple: Everted at rest and after stimulation  Comfort (Breast/Nipple): Filling, red/small blisters or bruises, mild/mod discomfort  Hold (Positioning): No assistance needed to correctly position infant at breast.  LATCH Score: 8  Interventions Interventions: Breast feeding basics reviewed;Assisted with latch;Skin to skin;Breast massage;Breast compression;Support pillows;Position options;Adjust position  Lactation Tools Discussed/Used WIC Program: Yes(She picked  the [employer] food package)   Consult Status Consult Status: PRN    Coley Kulikowski Venetia ConstableS Sheily Lineman 06/06/2017, 9:42 PM

## 2017-06-07 MED ORDER — IBUPROFEN 600 MG PO TABS: 600.0000 mg | ORAL_TABLET | Freq: Four times a day (QID) | ORAL | 0 refills | Status: DC

## 2017-06-07 NOTE — Discharge Summary (Signed)
OB Discharge Summary     Patient Name: Jenna Mcgrath DOB: Oct 28, 1990 MRN: [id number]  Date of admission: 06/05/2017 Delivering Mcgrath: Jenna Mcgrath   Date of discharge: 06/07/2017  Admitting diagnosis: 39wks ctx Intrauterine pregnancy: 965w2d     Secondary diagnosis:  Principal Problem:   SVD (spontaneous vaginal delivery) Active Problems:   Positive GBS test   LGA (large for gestational age) fetus affecting management of mother, third trimester   Normal labor  Additional problems: Elevated blood pressure     Discharge diagnosis: Term Pregnancy Delivered                                                                                                Post partum procedures:none  Augmentation: AROM  Complications: None  Hospital course:  Onset of Labor With Vaginal Delivery     27 y.o. yo [id number] at 7965w2d was admitted in Active Labor on 06/05/2017. Patient had an uncomplicated labor course as follows:  Membrane Rupture Time/Date: 9:56 PM ,06/05/2017   Intrapartum Procedures: Episiotomy: None [1]                                         Lacerations:  None [1]  Patient had a delivery of a Viable infant. 06/05/2017  Information for the patient's newborn:  Jenna Mcgrath [[id number]][[id number]]  Delivery Method: Vag-Spont    Pateint had an uncomplicated postpartum course, other than mildly elevated BP prior to discharge. She will have a 1-week blood pressure check visit.   She is ambulating, tolerating a regular diet, passing flatus, and urinating well. Patient is discharged home in stable condition on 06/07/17.   Physical exam  Vitals:   06/06/17 0649 06/06/17 1500 06/06/17 1847 06/07/17 0529  BP: (!) 126/7 129/69 (!) 125/57 135/70  Pulse: 68 86 94 87  Resp: 18 18 17 18   Temp: 97.8 F (36.6 C) 97.7 F (36.5 C) 98.2 F (36.8 C) 98.3 F (36.8 C)  TempSrc: Oral Oral Oral Oral  SpO2:  100% 100%   Weight:      Height:       General: alert and cooperative Lochia:  appropriate Uterine Fundus: firm Incision: N/A DVT Evaluation: No evidence of DVT seen on physical exam. No significant calf/ankle edema. Labs: Lab Results  Component Value Date   WBC 12.9 (H) 06/06/2017   HGB 11.5 (L) 06/06/2017   HCT 34.6 (L) 06/06/2017   MCV 86.5 06/06/2017   PLT 256 06/06/2017   CMP Latest Ref Rng & Units 06/05/2017  Glucose 65 - 99 mg/dL 74  BUN 6 - 20 mg/dL 7  Creatinine 6.960.44 - 2.951.00 mg/dL 2.840.58  Sodium 132135 - 440145 mmol/L 134(L)  Potassium 3.5 - 5.1 mmol/L 3.8  Chloride 101 - 111 mmol/L 103  CO2 22 - 32 mmol/L 23  Calcium 8.9 - 10.3 mg/dL 9.1  Total Protein 6.5 - 8.1 g/dL 7.3  Total Bilirubin 0.3 - 1.2 mg/dL 0.3  Alkaline Phos 38 - 126 U/L 237(H)  AST 15 -  41 U/L 23  ALT 14 - 54 U/L 14    Discharge instruction: per After Visit Summary and "Baby and Me Booklet".  After visit meds:  Allergies as of 06/07/2017   No Known Allergies     Medication List    STOP taking these medications   COMFORT FIT MATERNITY SUPP LG Misc     TAKE these medications   ibuprofen 600 MG tablet Commonly known as:  ADVIL,MOTRIN Take 1 tablet (600 mg total) by mouth every 6 (six) hours.   PRENATE PIXIE 10-0.6-0.4-200 MG Caps Take 1 tablet by mouth daily.   Vitamin D (Ergocalciferol) 50000 units Caps capsule Commonly known as:  DRISDOL Take 1 capsule (50,000 Units total) by mouth every 7 (seven) days.       Diet: routine diet  Activity: Advance as tolerated. Pelvic rest for 6 weeks.   Outpatient follow up: 1-wk BP check Follow up Appt: Future Appointments  Date Time Provider Department Center  07/04/2017  8:45 AM Conan Bowens, Mcgrath [facility] None   Follow up Visit:No Follow-up on file.  Postpartum contraception: Nexplanon  Newborn Data: Live born female  Birth Weight: 7 lb 2.8 oz (3255 g) APGAR: 9, 9  Newborn Delivery   Birth date/time:  06/05/2017 22:46:00 Delivery type:  Vaginal, Spontaneous     Baby Feeding: Breast Disposition:home with  mother   06/07/2017 Jenna Mcgrath

## 2017-06-07 NOTE — Discharge Instructions (Signed)
Postpartum Care After Vaginal Delivery °The period of time right after you deliver your newborn is called the postpartum period. °What kind of medical care will I receive? °· You may continue to receive fluids and medicines through an IV tube inserted into one of your veins. °· If an incision was made near your vagina (episiotomy) or if you had some vaginal tearing during delivery, cold compresses may be placed on your episiotomy or your tear. This helps to reduce pain and swelling. °· You may be given a squirt bottle to use when you go to the bathroom. You may use this until you are comfortable wiping as usual. To use the squirt bottle, follow these steps: °? Before you urinate, fill the squirt bottle with warm water. Do not use hot water. °? After you urinate, while you are sitting on the toilet, use the squirt bottle to rinse the area around your urethra and vaginal opening. This rinses away any urine and blood. °? You may do this instead of wiping. As you start healing, you may use the squirt bottle before wiping yourself. Make sure to wipe gently. °? Fill the squirt bottle with clean water every time you use the bathroom. °· You will be given sanitary pads to wear. °How can I expect to feel? °· You may not feel the need to urinate for several hours after delivery. °· You will have some soreness and pain in your abdomen and vagina. °· If you are breastfeeding, you may have uterine contractions every time you breastfeed for up to several weeks postpartum. Uterine contractions help your uterus return to its normal size. °· It is normal to have vaginal bleeding (lochia) after delivery. The amount and appearance of lochia is often similar to a menstrual period in the first week after delivery. It will gradually decrease over the next few weeks to a dry, yellow-brown discharge. For most women, lochia stops completely by 6-8 weeks after delivery. Vaginal bleeding can vary from woman to woman. °· Within the first few  days after delivery, you may have breast engorgement. This is when your breasts feel heavy, full, and uncomfortable. Your breasts may also throb and feel hard, tightly stretched, warm, and tender. After this occurs, you may have milk leaking from your breasts. Your health care provider can help you relieve discomfort due to breast engorgement. Breast engorgement should go away within a few days. °· You may feel more sad or worried than normal due to hormonal changes after delivery. These feelings should not last more than a few days. If these feelings do not go away after several days, speak with your health care provider. °How should I care for myself? °· Tell your health care provider if you have pain or discomfort. °· Drink enough water to keep your urine clear or pale yellow. °· Wash your hands thoroughly with soap and water for at least 20 seconds after changing your sanitary pads, after using the toilet, and before holding or feeding your baby. °· If you are not breastfeeding, avoid touching your breasts a lot. Doing this can make your breasts produce more milk. °· If you become weak or lightheaded, or you feel like you might faint, ask for help before: °? Getting out of bed. °? Showering. °· Change your sanitary pads frequently. Watch for any changes in your flow, such as a sudden increase in volume, a change in color, the passing of large blood clots. If you pass a blood clot from your vagina, save it   to show to your health care provider. Do not flush blood clots down the toilet without having your health care provider look at them. °· Make sure that all your vaccinations are up to date. This can help protect you and your baby from getting certain diseases. You may need to have immunizations done before you leave the hospital. °· If desired, talk with your health care provider about methods of family planning or birth control (contraception). °How can I start bonding with my baby? °Spending as much time as  possible with your baby is very important. During this time, you and your baby can get to know each other and develop a bond. Having your baby stay with you in your room (rooming in) can give you time to get to know your baby. Rooming in can also help you become comfortable caring for your baby. Breastfeeding can also help you bond with your baby. °How can I plan for returning home with my baby? °· Make sure that you have a car seat installed in your vehicle. °? Your car seat should be checked by a certified car seat installer to make sure that it is installed safely. °? Make sure that your baby fits into the car seat safely. °· Ask your health care provider any questions you have about caring for yourself or your baby. Make sure that you are able to contact your health care provider with any questions after leaving the hospital. °This information is not intended to replace advice given to you by your health care provider. Make sure you discuss any questions you have with your health care provider. °Document Released: 01/30/2007 Document Revised: 09/07/2015 Document Reviewed: 03/09/2015 °Elsevier Interactive Patient Education © 2018 [employer] ° °

## 2017-06-08 ENCOUNTER — Ambulatory Visit: Payer: Self-pay

## 2017-06-08 NOTE — Lactation Note (Signed)
This note was copied from a baby's chart. Lactation Consultation Note  Patient Name: Jenna Mcgrath ZOXWR'UToday's Date: 06/08/2017  Mom states baby is now latching.  She is not using a nipple shield.  RN initiated DEBP this AM and baby was syringe fed 22 mls of expressed milk.  Stressed importance of frequent feeds and post pumping.  Encouraged to call for assist prn.   Maternal Data    Feeding Feeding Type: Breast Milk  LATCH Score                   Interventions    Lactation Tools Discussed/Used     Consult Status      Huston FoleyMOULDEN, Keaundra Stehle S 06/08/2017, 2:33 PM

## 2017-06-08 NOTE — Lactation Note (Signed)
This note was copied from a baby's chart. Lactation Consultation Note Baby 9752 hrs old. Placed on DPT. Baby fussy. Wanting to cluster feed. Mom's nipples are hurting. Bruised. Baby Bf on Lt. Breast in side lying position. When came off nipple in lipstick shape. Has bruising on tip. Mom states Rt. Nipple hurts really bad. Baby Bf 90 min.  Mom's breast are filling, mom's mature milk is coming in. Encouraged to use hospital DEBP to relieve breast and supplement baby. Baby not acting satisfied. Oral mucosa looks a little dry. Can see frenulum when baby cries in front, appears to have anterior frenulum. Lips chapped from feeding so long. Cup fed w/foley cup 5 ml milk, baby probley spit out 1 ml. Baby only slept 5 min. After supplementing. Moms breast heavy after BF. Encouraged to feel breast before and after BF for transfer of milk.  Encouraged mom to post pump to prevent engorgement.  Fitted mom w/#24 NS. Encouraged to feed in football hold. Baby on DPT needs to feed w/light on. Taught mom application, mom demonstrated applying Ns. Discussed chin tug.  Comfort gels given. Shells given for sore nipples and to help evert nipple more. But if using NS will not need unless wants them for sore nipples. Will ask RN to set up DEBP, stress importance to mom not to get engorged. Discussed prevention and maintance.  Patient Name: Girl Ralph DowdyKani Braid ZOXWR'UToday's Date: 06/08/2017 Reason for consult: Follow-up assessment;Nipple pain/trauma;Hyperbilirubinemia   Maternal Data    Feeding Feeding Type: Breast Fed Length of feed: 20 min  LATCH Score Latch: Grasps breast easily, tongue down, lips flanged, rhythmical sucking.  Audible Swallowing: Spontaneous and intermittent  Type of Nipple: Everted at rest and after stimulation  Comfort (Breast/Nipple): Engorged, cracked, bleeding, large blisters, severe discomfort  Hold (Positioning): Assistance needed to correctly position infant at breast and maintain  latch.  LATCH Score: 7  Interventions Interventions: Breast feeding basics reviewed;Coconut oil;Breast compression;Shells;Adjust position;Comfort gels;Breast massage;Support pillows;Position options;Hand express;Expressed milk  Lactation Tools Discussed/Used Tools: Shells;Pump;Coconut oil;Comfort gels;Nipple Shields Nipple shield size: 24 Shell Type: Inverted   Consult Status Consult Status: Follow-up Date: 06/08/17 Follow-up type: In-patient    Charyl DancerCARVER, Keira Bohlin G 06/08/2017, 3:40 AM

## 2017-06-09 ENCOUNTER — Ambulatory Visit: Payer: Self-pay

## 2017-06-09 NOTE — Lactation Note (Signed)
This note was copied from a baby's chart. Lactation Consultation Note Baby 5674 hrs old. Mom engorged. Mom has been Bf for an hour. Noted large hard knots to Lt. Breast. Breast heavy, tender, aching. Coconut oil applied. Mom hand pumped prior to [facility] coming and pumped 20 ml. debp and breast massage mom pumped 30 ml. Noted improvement. Baby fussy. Syring fed baby 15 ml. Passing gas. Hasn't stooled since am. Lots of voids. Baby resting quietly after supplementing. Lips chapped. Noted anterior frenulum. Discussed w/mom assessing breast before and after feeding for transfer significantly of milk. Breast should feel much softer. Baby cont. To be on DPT. Laid mom flat, applied cloth w/ICE bags. Instructed to remove in 20 min. And rest.  Discussed engorgement prevention and maintained.   Patient Name: Jenna Mcgrath WUJWJ'XToday's Date: 06/09/2017 Reason for consult: Follow-up assessment;Engorgement;Hyperbilirubinemia   Maternal Data    Feeding Feeding Type: Breast Milk Length of feed: 60 min  LATCH Score       Type of Nipple: Everted at rest and after stimulation  Comfort (Breast/Nipple): Engorged, cracked, bleeding, large blisters, severe discomfort        Interventions Interventions: Breast compression;Support pillows;DEBP;Position options;Breast massage;Ice;Coconut oil  Lactation Tools Discussed/Used     Consult Status Consult Status: Follow-up Date: 06/09/17 Follow-up type: In-patient    Jenna Mcgrath, Jenna Mcgrath 06/09/2017, 1:38 AM

## 2017-06-09 NOTE — Lactation Note (Signed)
This note was copied from a baby's chart. Lactation Consultation Note  Patient Name: Girl Ralph DowdyKani Hession ZOXWR'UToday's Date: 06/09/2017  Mom reports baby is latching well and she continues to post pump and syringe feeds expressed milk.  Mom states she is syringe feeding well.  Possible discharge later today.  Mom has a pump at home.   Maternal Data    Feeding Feeding Type: Breast Fed  LATCH Score Latch: Grasps breast easily, tongue down, lips flanged, rhythmical sucking.  Audible Swallowing: Spontaneous and intermittent  Type of Nipple: Everted at rest and after stimulation  Comfort (Breast/Nipple): Soft / non-tender  Hold (Positioning): No assistance needed to correctly position infant at breast.  LATCH Score: 10  Interventions Interventions: Breast feeding basics reviewed;Support pillows;Adjust position  Lactation Tools Discussed/Used     Consult Status      Huston FoleyMOULDEN, Pardeep Pautz S 06/09/2017, 9:30 AM

## 2017-06-20 ENCOUNTER — Encounter: Payer: Self-pay | Admitting: Certified Nurse Midwife

## 2017-06-20 ENCOUNTER — Other Ambulatory Visit (HOSPITAL_COMMUNITY)
Admission: RE | Admit: 2017-06-20 | Discharge: 2017-06-20 | Disposition: A | Discharge status: Home / Self Care | Payer: Medicaid Other | Source: Ambulatory Visit | Attending: Obstetrics and Gynecology | Admitting: Obstetrics and Gynecology

## 2017-06-20 ENCOUNTER — Ambulatory Visit (INDEPENDENT_AMBULATORY_CARE_PROVIDER_SITE_OTHER): Payer: Medicaid Other | Admitting: Certified Nurse Midwife

## 2017-06-20 VITALS — BP 128/84 | HR 81 | Wt 234.0 lb

## 2017-06-20 DIAGNOSIS — Z30017 Encounter for initial prescription of implantable subdermal contraceptive: Secondary | ICD-10-CM | POA: Insufficient documentation

## 2017-06-20 DIAGNOSIS — N898 Other specified noninflammatory disorders of vagina: Secondary | ICD-10-CM | POA: Insufficient documentation

## 2017-06-20 DIAGNOSIS — Z1389 Encounter for screening for other disorder: Secondary | ICD-10-CM

## 2017-06-20 DIAGNOSIS — Z932 Ileostomy status: Secondary | ICD-10-CM | POA: Diagnosis not present

## 2017-06-20 DIAGNOSIS — Z3049 Encounter for surveillance of other contraceptives: Secondary | ICD-10-CM | POA: Diagnosis not present

## 2017-06-20 HISTORY — DX: Encounter for initial prescription of implantable subdermal contraceptive: Z30.017

## 2017-06-20 NOTE — Progress Notes (Signed)
Post Partum Exam  Jenna DowdyKani Mcgrath is a 27 y.o. [phone] female who presents for a postpartum visit. She is 2 weeks postpartum following a spontaneous vaginal delivery. I have fully reviewed the prenatal and intrapartum course. The delivery was at 39 gestational weeks.  Anesthesia: none. Postpartum course has been doing well. Baby's course has been doing well. Baby is feeding by Breast.   Bleeding thin lochia. Bowel function is normal. Bladder function is normal. Patient is not sexually active. Contraception method is abstinence.  Postpartum depression screening:neg   Assessment:    Normal 2 week postpartum exam. Pap smear not done at today's visit.   Plan:   1. Contraception: abstinence and Nexplanon 2. Nexplanon placed today see note below.   3. Follow up in: 4 weeks for postpartum exam or as needed.   Nexplanon Procedure Note   PRE-OP DIAGNOSIS: desired long-term, reversible contraception  POST-OP DIAGNOSIS: Same  PROCEDURE: Nexplanon  placement Performing Provider: Orvilla Cornwallachelle Denney CNM   Patient education prior to procedure, explained risk, benefits of Nexplanon, reviewed alternative options. Patient reported understanding. Gave consent to continue with procedure.   PROCEDURE:  Pregnancy Text :  Negative Site (check):      left arm         Sterile Preparation:   Betadinex3 Lot # [id number]  [id number] Expiration Date Nov 29 2019  Insertion site was selected 8 - 10 cm from medial epicondyle and marked along with guiding site using sterile marker. Procedure area was prepped and draped in a sterile fashion. 1% Lidocaine 1.5 ml given prior to procedure. Nexplanon  was inserted subcutaneously.Needle was removed from the insertion site. Nexplanon capsule was palpated by provider and patient to assure satisfactory placement. And a bandage applied and the arm was wrapped with gauze bandage.     Followup: The patient tolerated the procedure well without complications.  Instructions:  The patient  was instructed to remove the dressing in 24 hours and that some bruising is to be expected.  She was advised to use over the counter analgesics as needed for any pain at the site.  She is to keep the area dry for 24 hours and to call if her hand or arm becomes cold, numb, or blue.   Orvilla Cornwallachelle Denney CNM

## 2017-06-22 LAB — URINE CYTOLOGY ANCILLARY ONLY: CANDIDA VAGINITIS: NEGATIVE

## 2017-06-22 MED ORDER — ETONOGESTREL 68 MG ~~LOC~~ IMPL
68.0000 mg | DRUG_IMPLANT | Freq: Once | SUBCUTANEOUS | Status: AC
  Administered 2017-06-20: 68 mg via SUBCUTANEOUS

## 2017-06-22 NOTE — Addendum Note (Signed)
Addended by: Marya LandryFOSTER, Nakaila Freeze D on: 06/22/2017 08:43 AM   Modules accepted: Orders

## 2017-06-23 ENCOUNTER — Other Ambulatory Visit: Payer: Self-pay | Admitting: Certified Nurse Midwife

## 2017-06-23 DIAGNOSIS — N760 Acute vaginitis: Principal | ICD-10-CM

## 2017-06-23 DIAGNOSIS — B9689 Other specified bacterial agents as the cause of diseases classified elsewhere: Secondary | ICD-10-CM

## 2017-06-23 MED ORDER — METRONIDAZOLE 500 MG PO TABS: 500.0000 mg | ORAL_TABLET | Freq: Two times a day (BID) | ORAL | 0 refills | Status: DC

## 2017-07-04 ENCOUNTER — Ambulatory Visit: Payer: Medicaid Other | Admitting: Obstetrics and Gynecology

## 2017-07-18 ENCOUNTER — Ambulatory Visit: Payer: Medicaid Other | Admitting: Obstetrics and Gynecology

## 2017-07-20 ENCOUNTER — Telehealth: Payer: Self-pay | Admitting: *Deleted

## 2017-07-20 NOTE — Telephone Encounter (Signed)
Called pt to reschedule PP visit that was missed on 07/18/2017 pt did not answer and voice mal box was not setup yet, unable to leave a msg.

## 2018-11-19 ENCOUNTER — Other Ambulatory Visit: Payer: Self-pay

## 2018-11-19 DIAGNOSIS — Z20822 Contact with and (suspected) exposure to covid-19: Secondary | ICD-10-CM

## 2018-11-21 LAB — NOVEL CORONAVIRUS, NAA: SARS-CoV-2, NAA: NOT DETECTED

## 2019-12-04 ENCOUNTER — Ambulatory Visit
Admission: EM | Admit: 2019-12-04 | Discharge: 2019-12-04 | Disposition: A | Discharge status: Home / Self Care | Payer: Medicaid Other | Attending: Internal Medicine | Admitting: Internal Medicine

## 2019-12-04 ENCOUNTER — Other Ambulatory Visit: Payer: Self-pay

## 2019-12-04 DIAGNOSIS — B373 Candidiasis of vulva and vagina: Secondary | ICD-10-CM | POA: Insufficient documentation

## 2019-12-04 DIAGNOSIS — N760 Acute vaginitis: Secondary | ICD-10-CM | POA: Diagnosis not present

## 2019-12-04 DIAGNOSIS — B9689 Other specified bacterial agents as the cause of diseases classified elsewhere: Secondary | ICD-10-CM | POA: Diagnosis present

## 2019-12-04 DIAGNOSIS — B3731 Acute candidiasis of vulva and vagina: Secondary | ICD-10-CM

## 2019-12-04 LAB — WET PREP, GENITAL
Sperm: NONE SEEN
Trich, Wet Prep: NONE SEEN

## 2019-12-04 LAB — CHLAMYDIA/NGC RT PCR (ARMC ONLY)
Chlamydia Tr: NOT DETECTED
N gonorrhoeae: NOT DETECTED

## 2019-12-04 MED ORDER — FLUCONAZOLE 150 MG PO TABS: 150.0000 mg | ORAL_TABLET | Freq: Once | ORAL | 0 refills | Status: AC

## 2019-12-04 MED ORDER — METRONIDAZOLE 500 MG PO TABS: 500.0000 mg | ORAL_TABLET | Freq: Two times a day (BID) | ORAL | 0 refills | Status: DC

## 2019-12-04 NOTE — ED Triage Notes (Signed)
Patient complains of increased vaginal discharge and itching. Would like to be tested for STDs.

## 2019-12-04 NOTE — ED Provider Notes (Signed)
[facility]    CSN: [id number] Arrival date & time: 12/04/19  0913      History   Chief Complaint Chief Complaint  Patient presents with  . Vaginal Itching    HPI Jenna Mcgrath is a 29 y.o. female comes to the urgent care with complaints of increased vaginal discharge and itching of 1 week duration.  Patient says symptoms started a week ago and has been persistent.  Discharge is white and itchy.  She denies any dysuria urgency or frequency.  No deep dyspareunia.  Patient has 1 sexual partner and engages in unprotected sex.  She is on birth control pills.   HPI  Past Medical History:  Diagnosis Date  . Medical history non-contributory     Patient Active Problem List   Diagnosis Date Noted  . Encounter for initial prescription of Nexplanon 06/20/2017  . Vitamin D deficiency 12/05/2016    Past Surgical History:  Procedure Laterality Date  . TONSILLECTOMY AND ADENOIDECTOMY    . VULVA SURGERY      OB History    Gravida  4   Para  2   Term  2   Preterm      AB  2   Living  2     SAB  1   TAB  1   Ectopic      Multiple  0   Live Births  2            Home Medications    Prior to Admission medications   Medication Sig Start Date End Date Taking? Authorizing Provider  fluconazole (DIFLUCAN) 150 MG tablet Take 1 tablet (150 mg total) by mouth once for 1 dose. May repeat in 72 hours if no improvement in symptoms 12/04/19 12/04/19  Burle Kwan, Britta Mccreedy, MD  metroNIDAZOLE (FLAGYL) 500 MG tablet Take 1 tablet (500 mg total) by mouth 2 (two) times daily. 12/04/19   LampteyBritta Mccreedy, MD    Family History Family History  Problem Relation Age of Onset  . Cancer Maternal Grandmother   . Diabetes Maternal Grandmother   . Diabetes Paternal Grandmother   . Colon cancer Paternal Grandfather   . Diabetes Mother   . Diabetes Father   . Hypertension Father     Social History Social History   Tobacco Use  . Smoking status: Current Every Day Smoker     Packs/day: 2.00    Types: Cigarettes, Cigars  . Smokeless tobacco: Never Used  Vaping Use  . Vaping Use: Never used  Substance Use Topics  . Alcohol use: No    Comment: Socially  . Drug use: No     Allergies   Patient has no known allergies.   Review of Systems Review of Systems  Constitutional: Negative.   Gastrointestinal: Negative.   Genitourinary: Positive for vaginal discharge. Negative for dysuria, frequency, urgency, vaginal bleeding and vaginal pain.  Musculoskeletal: Negative.   Neurological: Negative.      Physical Exam Triage Vital Signs ED Triage Vitals  Enc Vitals Group     BP 12/04/19 1049 129/77     Pulse Rate 12/04/19 1049 65     Resp 12/04/19 1049 18     Temp 12/04/19 1049 98.7 F (37.1 C)     Temp Source 12/04/19 1049 Oral     SpO2 12/04/19 1049 100 %     Weight 12/04/19 1046 205 lb (93 kg)     Height 12/04/19 1046 5\' 7"  (1.702 m)  Head Circumference --      Peak Flow --      Pain Score 12/04/19 1045 4     Pain Loc --      Pain Edu? --      Excl. in GC? --    No data found.  Updated Vital Signs BP 129/77 (BP Location: Left Arm)   Pulse 65   Temp 98.7 F (37.1 C) (Oral)   Resp 18   Ht 5\' 7"  (1.702 m)   Wt 93 kg   SpO2 100%   Breastfeeding No   BMI 32.11 kg/m   Visual Acuity Right Eye Distance:   Left Eye Distance:   Bilateral Distance:    Right Eye Near:   Left Eye Near:    Bilateral Near:     Physical Exam Constitutional:      Appearance: Normal appearance.  Cardiovascular:     Rate and Rhythm: Normal rate and regular rhythm.  Skin:    General: Skin is warm.  Neurological:     Mental Status: She is alert.      UC Treatments / Results  Labs (all labs ordered are listed, but only abnormal results are displayed) Labs Reviewed  WET PREP, GENITAL - Abnormal; Notable for the following components:      Result Value   Yeast Wet Prep HPF POC PRESENT (*)    Clue Cells Wet Prep HPF POC PRESENT (*)    WBC, Wet  Prep HPF POC RARE (*)    All other components within normal limits  CHLAMYDIA/NGC RT PCR [facility] ONLY)    EKG   Radiology No results found.  Procedures Procedures (including critical care time)  Medications Ordered in UC Medications - No data to display  Initial Impression / Assessment and Plan / UC Course  I have reviewed the triage vital signs and the nursing notes.  Pertinent labs & imaging results that were available during my care of the patient were reviewed by me and considered in my medical decision making (see chart for details).     1.  Acute vulvovaginitis (bacterial vaginosis and vaginal yeast infection): Fluconazole 150 mg x 1 dose-may be repeated in 72 hours if no improvement Flagyl 500 mg twice daily x7 days Wet prep is positive for yeast and clue cells GC/chlamydia is pending Trichomonas is negative. Final Clinical Impressions(s) / UC Diagnoses   Final diagnoses:  Bacterial vaginosis  Vaginal yeast infection   Discharge Instructions   None    ED Prescriptions    Medication Sig Dispense Auth. Provider   fluconazole (DIFLUCAN) 150 MG tablet Take 1 tablet (150 mg total) by mouth once for 1 dose. May repeat in 72 hours if no improvement in symptoms 2 tablet Teryn Boerema, [facility], MD   metroNIDAZOLE (FLAGYL) 500 MG tablet Take 1 tablet (500 mg total) by mouth 2 (two) times daily. 14 tablet Keontae Levingston, Britta Mccreedy, MD     PDMP not reviewed this encounter.   Britta Mccreedy, MD 12/04/19 [phone]

## 2020-02-03 ENCOUNTER — Other Ambulatory Visit: Payer: Self-pay

## 2020-02-03 DIAGNOSIS — F17290 Nicotine dependence, other tobacco product, uncomplicated: Secondary | ICD-10-CM | POA: Insufficient documentation

## 2020-02-03 DIAGNOSIS — F17210 Nicotine dependence, cigarettes, uncomplicated: Secondary | ICD-10-CM | POA: Diagnosis not present

## 2020-02-03 DIAGNOSIS — N200 Calculus of kidney: Secondary | ICD-10-CM | POA: Diagnosis not present

## 2020-02-03 DIAGNOSIS — R109 Unspecified abdominal pain: Secondary | ICD-10-CM | POA: Diagnosis present

## 2020-02-03 DIAGNOSIS — N23 Unspecified renal colic: Secondary | ICD-10-CM | POA: Diagnosis not present

## 2020-02-03 NOTE — ED Triage Notes (Signed)
Pt to ED reporting right sided flank and abd pain that has been worsening over the past couple days. No NVD, decreased PO intake reported.

## 2020-02-04 ENCOUNTER — Emergency Department
Admission: EM | Admit: 2020-02-04 | Discharge: 2020-02-04 | Disposition: A | Discharge status: Home / Self Care | Payer: Medicaid Other | Attending: Emergency Medicine | Admitting: Emergency Medicine

## 2020-02-04 ENCOUNTER — Emergency Department: Payer: Medicaid Other

## 2020-02-04 DIAGNOSIS — R109 Unspecified abdominal pain: Secondary | ICD-10-CM

## 2020-02-04 DIAGNOSIS — N200 Calculus of kidney: Secondary | ICD-10-CM

## 2020-02-04 DIAGNOSIS — N23 Unspecified renal colic: Secondary | ICD-10-CM

## 2020-02-04 LAB — URINALYSIS, COMPLETE (UACMP) WITH MICROSCOPIC
Bilirubin Urine: NEGATIVE
Glucose, UA: NEGATIVE mg/dL
Ketones, ur: NEGATIVE mg/dL
Leukocytes,Ua: NEGATIVE
Nitrite: NEGATIVE
Protein, ur: NEGATIVE mg/dL
Specific Gravity, Urine: 1.009 (ref 1.005–1.030)
pH: 7 (ref 5.0–8.0)

## 2020-02-04 LAB — COMPREHENSIVE METABOLIC PANEL
ALT: 16 U/L (ref 0–44)
AST: 21 U/L (ref 15–41)
Albumin: 4.1 g/dL (ref 3.5–5.0)
Alkaline Phosphatase: 69 U/L (ref 38–126)
Anion gap: 8 (ref 5–15)
BUN: 12 mg/dL (ref 6–20)
CO2: 26 mmol/L (ref 22–32)
Calcium: 8.8 mg/dL — ABNORMAL LOW (ref 8.9–10.3)
Chloride: 104 mmol/L (ref 98–111)
Creatinine, Ser: 0.79 mg/dL (ref 0.44–1.00)
GFR, Estimated: >60 mL/min (ref >60)
Glucose, Bld: 94 mg/dL (ref 70–99)
Potassium: 3.9 mmol/L (ref 3.5–5.1)
Sodium: 138 mmol/L (ref 135–145)
Total Bilirubin: 0.7 mg/dL (ref 0.3–1.2)
Total Protein: 7.5 g/dL (ref 6.5–8.1)

## 2020-02-04 LAB — CBC
HCT: 39.4 % (ref 36.0–46.0)
Hemoglobin: 12.8 g/dL (ref 12.0–15.0)
MCH: 29.1 pg (ref 26.0–34.0)
MCHC: 32.5 g/dL (ref 30.0–36.0)
MCV: 89.5 fL (ref 80.0–100.0)
Platelets: 324 10*3/uL (ref 150–400)
RBC: 4.4 MIL/uL (ref 3.87–5.11)
RDW: 12.7 % (ref 11.5–15.5)
WBC: 7.8 10*3/uL (ref 4.0–10.5)
nRBC: 0 % (ref 0.0–0.2)

## 2020-02-04 LAB — PREGNANCY, URINE: Preg Test, Ur: NEGATIVE

## 2020-02-04 LAB — POC URINE PREG, ED: Preg Test, Ur: NEGATIVE

## 2020-02-04 LAB — LIPASE, BLOOD: Lipase: 30 U/L (ref 11–51)

## 2020-02-04 MED ORDER — TAMSULOSIN HCL 0.4 MG PO CAPS: 0.4000 mg | ORAL_CAPSULE | Freq: Once | ORAL | Status: DC

## 2020-02-04 MED ORDER — ONDANSETRON HCL 4 MG/2ML IJ SOLN
4.0000 mg | Freq: Once | INTRAMUSCULAR | Status: AC
  Administered 2020-02-04: 4 mg via INTRAVENOUS
  Filled 2020-02-04: qty 2

## 2020-02-04 MED ORDER — OXYCODONE-ACETAMINOPHEN 5-325 MG PO TABS: 1.0000 | ORAL_TABLET | Freq: Once | ORAL | Status: DC

## 2020-02-04 MED ORDER — ONDANSETRON 4 MG PO TBDP: 4.0000 mg | ORAL_TABLET | Freq: Three times a day (TID) | ORAL | 0 refills | Status: DC | PRN

## 2020-02-04 MED ORDER — SODIUM CHLORIDE 0.9 % IV BOLUS
1000.0000 mL | Freq: Once | INTRAVENOUS | Status: AC
  Administered 2020-02-04: 1000 mL via INTRAVENOUS

## 2020-02-04 MED ORDER — MORPHINE SULFATE (PF) 4 MG/ML IV SOLN
4.0000 mg | Freq: Once | INTRAVENOUS | Status: AC
  Administered 2020-02-04: 4 mg via INTRAVENOUS
  Filled 2020-02-04: qty 1

## 2020-02-04 MED ORDER — OXYCODONE-ACETAMINOPHEN 5-325 MG PO TABS: 1.0000 | ORAL_TABLET | ORAL | 0 refills | Status: DC | PRN

## 2020-02-04 MED ORDER — KETOROLAC TROMETHAMINE 30 MG/ML IJ SOLN
15.0000 mg | Freq: Once | INTRAMUSCULAR | Status: AC
  Administered 2020-02-04: 15 mg via INTRAVENOUS
  Filled 2020-02-04: qty 1

## 2020-02-04 MED ORDER — TAMSULOSIN HCL 0.4 MG PO CAPS: 0.4000 mg | ORAL_CAPSULE | Freq: Every day | ORAL | 0 refills | Status: DC

## 2020-02-04 NOTE — ED Notes (Signed)
Assumed care of pt upon being roomed. Reports R flank pain, pt tearful. Medicated per MAR. Provided with warm blankets and lights dimmed for comfort. AO x4, call bell within reach.

## 2020-02-04 NOTE — Discharge Instructions (Addendum)
1.  You may take pain and nausea medicine as needed (Percocet/Zofran). 2.  Drink plenty of water or filtered water daily. 3.  Take Flomax 0.4 mg daily x14 days. 4.  Return to the ER for worsening symptoms, persistent vomiting, difficulty breathing or other concerns.

## 2020-02-04 NOTE — ED Notes (Signed)
Pt up ad lib to toilet, strainer provided. 2 cream stones noted in strainer. Pt medicated per [facility]

## 2020-02-04 NOTE — ED Provider Notes (Addendum)
[facility] Emergency Department Provider Note   ____________________________________________   First MD Initiated Contact with Patient 02/04/20 [phone]     (approximate)  I have reviewed the triage vital signs and the nursing notes.   HISTORY  Chief Complaint Flank Pain    HPI Jenna Mcgrath is a 29 y.o. female who presents to the ED from home with a 4-day history of right-sided flank and abdominal pain.  Symptoms not associated with nausea/vomiting or diarrhea.  Decreased oral intake secondary to pain.  Denies fever, cough, chest pain, shortness of breath, dysuria.  Denies recent travel or trauma.     Past Medical History:  Diagnosis Date  . Medical history non-contributory     Patient Active Problem List   Diagnosis Date Noted  . Encounter for initial prescription of Nexplanon 06/20/2017  . Vitamin D deficiency 12/05/2016    Past Surgical History:  Procedure Laterality Date  . TONSILLECTOMY AND ADENOIDECTOMY    . VULVA SURGERY      Prior to Admission medications   Medication Sig Start Date End Date Taking? Authorizing Provider  metroNIDAZOLE (FLAGYL) 500 MG tablet Take 1 tablet (500 mg total) by mouth 2 (two) times daily. 12/04/19   Lamptey, Britta Mccreedy, MD  ondansetron (ZOFRAN ODT) 4 MG disintegrating tablet Take 1 tablet (4 mg total) by mouth every 8 (eight) hours as needed for nausea or vomiting. 02/04/20   Irean Hong, MD  oxyCODONE-acetaminophen (PERCOCET/ROXICET) 5-325 MG tablet Take 1 tablet by mouth every 4 (four) hours as needed for severe pain. 02/04/20   Irean Hong, MD  tamsulosin (FLOMAX) 0.4 MG CAPS capsule Take 1 capsule (0.4 mg total) by mouth daily. 02/04/20   Irean Hong, MD    Allergies Patient has no known allergies.  Family History  Problem Relation Age of Onset  . Cancer Maternal Grandmother   . Diabetes Maternal Grandmother   . Diabetes Paternal Grandmother   . Colon cancer Paternal Grandfather   . Diabetes Mother     . Diabetes Father   . Hypertension Father     Social History Social History   Tobacco Use  . Smoking status: Current Every Day Smoker    Packs/day: 2.00    Types: Cigarettes, Cigars  . Smokeless tobacco: Never Used  Vaping Use  . Vaping Use: Never used  Substance Use Topics  . Alcohol use: No    Comment: Socially  . Drug use: No    Review of Systems  Constitutional: No fever/chills Eyes: No visual changes. ENT: No sore throat. Cardiovascular: Denies chest pain. Respiratory: Denies shortness of breath. Gastrointestinal: Positive for right flank and abdominal pain.  No nausea, no vomiting.  No diarrhea.  No constipation. Genitourinary: Negative for dysuria. Musculoskeletal: Negative for back pain. Skin: Negative for rash. Neurological: Negative for headaches, focal weakness or numbness.   ____________________________________________   PHYSICAL EXAM:  VITAL SIGNS: ED Triage Vitals [02/03/20 2336]  Enc Vitals Group     BP      Pulse      Resp      Temp      Temp src      SpO2      Weight 205 lb 0.4 oz (93 kg)     Height 5\' 7"  (1.702 m)     Head Circumference      Peak Flow      Pain Score      Pain Loc      Pain Edu?  Excl. in GC?     Constitutional: Alert and oriented. Well appearing and in mild acute distress. Eyes: Conjunctivae are normal. PERRL. EOMI. Head: Atraumatic. Nose: No congestion/rhinnorhea. Mouth/Throat: Mucous membranes are moist.   Neck: No stridor.   Cardiovascular: Normal rate, regular rhythm. Grossly normal heart sounds.  Good peripheral circulation. Respiratory: Normal respiratory effort.  No retractions. Lungs CTAB. Gastrointestinal: Soft and nontender to light or deep palpation. No distention. No abdominal bruits.  Mild right CVA tenderness. Musculoskeletal: No lower extremity tenderness nor edema.  No joint effusions. Neurologic:  Normal speech and language. No gross focal neurologic deficits are appreciated. No gait  instability. Skin:  Skin is warm, dry and intact. No rash noted. Psychiatric: Mood and affect are normal. Speech and behavior are normal.  ____________________________________________   LABS (all labs ordered are listed, but only abnormal results are displayed)  Labs Reviewed  COMPREHENSIVE METABOLIC PANEL - Abnormal; Notable for the following components:      Result Value   Calcium 8.8 (*)    All other components within normal limits  URINALYSIS, COMPLETE (UACMP) WITH MICROSCOPIC - Abnormal; Notable for the following components:   Color, Urine YELLOW (*)    APPearance CLEAR (*)    Hgb urine dipstick LARGE (*)    Bacteria, UA RARE (*)    All other components within normal limits  LIPASE, BLOOD  CBC  PREGNANCY, URINE  POC URINE PREG, ED   ____________________________________________  EKG  ED ECG REPORT I, Brad Mcgaughy J, the attending physician, personally viewed and interpreted this ECG.   Date: 02/04/2020  EKG Time: 2346  Rate: 78  Rhythm: normal EKG, normal sinus rhythm  Axis: Normal  Intervals:none  ST&T Change: Nonspecific  ____________________________________________  RADIOLOGY I, Galilee Pierron J, personally viewed and evaluated these images (plain radiographs) as part of my medical decision making, as well as reviewing the written report by the radiologist.  ED MD interpretation: Right lower pole nephrolithiasis, normal appendix, mild right hydronephrosis versus extrarenal pelvis  Official radiology report(s): CT Renal Stone Study  Result Date: 02/04/2020 CLINICAL DATA:  29 year old female with right flank and abdominal pain for several days. EXAM: CT ABDOMEN AND PELVIS WITHOUT CONTRAST TECHNIQUE: Multidetector CT imaging of the abdomen and pelvis was performed following the standard protocol without IV contrast. COMPARISON:  None. FINDINGS: Lower chest: Negative. Hepatobiliary: Negative noncontrast liver and gallbladder. Pancreas: Negative. Spleen: Negative; several  punctate calcified granulomas. Adrenals/Urinary Tract: Normal adrenal glands. 3-4 mm right lower pole renal calculus. No definite left nephrolithiasis. No perinephric stranding on either side. Mild right hydronephrosis versus extrarenal pelvis (normal variant). And the right ureter tapers quickly with no periureteral stranding. Both ureters are difficult to delineate. There are bilateral pelvic phleboliths. No strong evidence of ureteral calculus. Unremarkable bladder. Stomach/Bowel: Negative large bowel aside from retained stool and occasional diverticula. Normal retrocecal appendix (series 2, image 46). Negative terminal ileum. No dilated small bowel. Decompressed stomach. No free air, free fluid, mesenteric stranding. Vascular/Lymphatic: Normal caliber aorta. Vascular patency is not evaluated in the absence of IV contrast. No lymphadenopathy. Reproductive: Negative; tampon in place. Other: No pelvic free fluid. Musculoskeletal: Negative. IMPRESSION: 1. Right lower pole nephrolithiasis but no strong evidence of ureteral calculus or obstructive uropathy. Right extrarenal pelvis suspected (normal variant). 2. Normal appendix. No inflammatory process identified in the noncontrast abdomen or pelvis. Electronically Signed   By: Odessa Fleming M.D.   On: 02/04/2020 03:11    ____________________________________________   PROCEDURES  Procedure(s) performed (including Critical Care):  Procedures  ____________________________________________   INITIAL IMPRESSION / ASSESSMENT AND PLAN / ED COURSE  As part of my medical decision making, I reviewed the following data within the electronic MEDICAL RECORD NUMBER Nursing notes reviewed and incorporated, Labs reviewed, EKG interpreted, Old chart reviewed, Radiograph reviewed, Notes from prior ED visits and [place] Controlled Substance Database     29 year old female presenting with right flank and abdominal pain. Differential diagnosis includes, but is not limited to, acute  appendicitis, renal colic, testicular torsion, urinary tract infection/pyelonephritis, prostatitis,  epididymitis, diverticulitis, small bowel obstruction or ileus, colitis, abdominal aortic aneurysm, gastroenteritis, hernia, etc.  Laboratory results unremarkable.  CT demonstrates right lower pole nephrolithiasis, mild right hydronephrosis secondary to probable passed.  Will initiate IV hydration, IV Toradol and reassess.  Clinical Course as of Feb 03 654  Tue Feb 04, 2020  6144 Patient feeling better after IV morphine.  Patient had to urinate and 2 sand-colored stones noted in strainer.  Will discharge home with prescription for Percocet and Zofran to use as needed.  Strict return precautions given.  Patient verbalizes understanding agrees with plan of care.   [JS]    Clinical Course User Index [JS] Irean Hong, MD     ____________________________________________   FINAL CLINICAL IMPRESSION(S) / ED DIAGNOSES  Final diagnoses:  Right flank pain  Nephrolithiasis  Ureteral colic     ED Discharge Orders         Ordered    oxyCODONE-acetaminophen (PERCOCET/ROXICET) 5-325 MG tablet  Every 4 hours PRN        02/04/20 0557    ondansetron (ZOFRAN ODT) 4 MG disintegrating tablet  Every 8 hours PRN        02/04/20 0557    tamsulosin (FLOMAX) 0.4 MG CAPS capsule  Daily        02/04/20 0651          *Please note:  Avrielle Fry was evaluated in Emergency Department on 02/04/2020 for the symptoms described in the history of present illness. She was evaluated in the context of the global COVID-19 pandemic, which necessitated consideration that the patient might be at risk for infection with the SARS-CoV-2 virus that causes COVID-19. Institutional protocols and algorithms that pertain to the evaluation of patients at risk for COVID-19 are in a state of rapid change based on information released by regulatory bodies including the CDC and federal and state organizations. These policies and  algorithms were followed during the patient's care in the ED.  Some ED evaluations and interventions may be delayed as a result of limited staffing during and the pandemic.*   Note:  This document was prepared using Dragon voice recognition software and may include unintentional dictation errors.   Irean Hong, MD 02/04/20 3154    Irean Hong, MD 02/04/20 [phone]

## 2020-02-07 ENCOUNTER — Emergency Department
Admission: EM | Admit: 2020-02-07 | Discharge: 2020-02-07 | Discharge status: Against Medical Advice | Payer: Medicaid Other

## 2020-02-07 NOTE — ED Triage Notes (Signed)
This RN to triage bathroom, pt found sitting on her hands and knees on bathroom floor. Pt repeatedly yelling "they just left me on the fucking bathroom floor, they just left me on the fucking bathroom floor". This RN notified by Marisue Ivan, RN that Marisue Ivan, RN attempted to help patient up, pt repeatedly stated to Marisue Ivan, RN that she was unable to get up and unable to sit in the wheelchair. Pt assisted to stand by this RN, pt minimal assist to stand and assisted to sit in her knees rear-facing in the wheelchair. Pt taking back to lobby by this RN per patient request. Pt on phone with her mom requesting to be taken to a different hospital, repeatedly yelling into phone and in lobby, "I have never, I have never, they fucking left me on the dirty ass bathroom floor, they just fucking looked at me and left me on the fucking floor!". Ashok Cordia, First RN made aware.

## 2020-02-07 NOTE — ED Triage Notes (Signed)
Pt comes into the ED via EMS from the parking lot, pt was seen here 2 days ago dx with kidney stones and has been taking pain meds, now having constipation with rectal pain. Pt was here this morning checked in and before she was called for triage left and went to the [employer] and called 911

## 2020-02-17 NOTE — Progress Notes (Signed)
02/18/2020 4:37 PM   Jenna Mcgrath 03-Sep-1990 [id number]  Referring provider: No referring provider defined for this encounter. Chief Complaint  Patient presents with  . Nephrolithiasis    HPI: Jenna Mcgrath is a 29 y.o. female who presents today for evaluation and management of right flank and abdominal pain.   UA from 02/03/2020 noted large blood on dipstick and rare bacteria.   She was seen in the ED on 02/04/2020 for right sided flank and abdominal pain. She had ongoing pain x 4 days. No nausea or vomiting. She had decreased oral intake secondary to pain. No fevers or chills. She received IV hydration, Toradol and morphine. The patient reportedly passed 2 sand-colored stones in strainer during visit. She was discharged on Percocet/roxicet, zofran and flomax.   CT renal stone study revealed right lower pole nephrolithiasis but no strong evidence of ureteral calculus or obstructive uropathy. Right extrarenal pelvis suspected (normal variant). Normal appendix. No inflammatory process identified in the noncontrast abdomen or pelvis.  She has had constant throbbing low back and abdomen pain for a few weeks. She works 3rd shift in a [employer] and feels like it contributes to pain. Denies bladder symptoms.   Family history of kidney stones.    PMH: Past Medical History:  Diagnosis Date  . Medical history non-contributory     Surgical History: Past Surgical History:  Procedure Laterality Date  . TONSILLECTOMY AND ADENOIDECTOMY    . VULVA SURGERY      Home Medications:  Allergies as of 02/18/2020   No Known Allergies     Medication List       Accurate as of February 18, 2020 11:59 PM. If you have any questions, ask your nurse or doctor.        STOP taking these medications   metroNIDAZOLE 500 MG tablet Commonly known as: FLAGYL Stopped by: Vanna Scotland, MD   ondansetron 4 MG disintegrating tablet Commonly known as: Zofran ODT Stopped by:  Vanna Scotland, MD   oxyCODONE-acetaminophen 5-325 MG tablet Commonly known as: PERCOCET/ROXICET Stopped by: Vanna Scotland, MD     TAKE these medications   tamsulosin 0.4 MG Caps capsule Commonly known as: Flomax Take 1 capsule (0.4 mg total) by mouth daily.       Allergies: No Known Allergies  Family History: Family History  Problem Relation Age of Onset  . Cancer Maternal Grandmother   . Diabetes Maternal Grandmother   . Diabetes Paternal Grandmother   . Colon cancer Paternal Grandfather   . Diabetes Mother   . Diabetes Father   . Hypertension Father     Social History:  reports that she has been smoking cigarettes and cigars. She has been smoking about 2.00 packs per day. She has never used smokeless tobacco. She reports that she does not drink alcohol and does not use drugs.   Physical Exam: BP 126/85   Pulse 82   Wt 205 lb (93 kg)   LMP 02/11/2020   BMI 32.11 kg/m   Constitutional:  Alert and oriented, No acute distress.  In wheel chair.   HEENT: [place] AT, moist mucus membranes.  Trachea midline, no masses. Cardiovascular: No clubbing, cyanosis, or edema. Respiratory: Normal respiratory effort, no increased work of breathing. Skin: No rashes, bruises or suspicious lesions. Neurologic: Grossly intact, no focal deficits, moving all 4 extremities. Psychiatric: Normal mood and affect.  Laboratory Data:  Lab Results  Component Value Date   CREATININE 0.79 02/03/2020    Urinalysis 3-10 RBC's and  many epithelial cells --> currently endorses menstrual spotting  Pertinent Imaging:  Results for orders placed during the hospital encounter of 02/04/20  CT Renal Stone Study  Narrative CLINICAL DATA:  29 year old female with right flank and abdominal pain for several days.  EXAM: CT ABDOMEN AND PELVIS WITHOUT CONTRAST  TECHNIQUE: Multidetector CT imaging of the abdomen and pelvis was performed following the standard protocol without IV  contrast.  COMPARISON:  None.  FINDINGS: Lower chest: Negative.  Hepatobiliary: Negative noncontrast liver and gallbladder.  Pancreas: Negative.  Spleen: Negative; several punctate calcified granulomas.  Adrenals/Urinary Tract: Normal adrenal glands.  3-4 mm right lower pole renal calculus. No definite left nephrolithiasis. No perinephric stranding on either side.  Mild right hydronephrosis versus extrarenal pelvis (normal variant). And the right ureter tapers quickly with no periureteral stranding.  Both ureters are difficult to delineate. There are bilateral pelvic phleboliths. No strong evidence of ureteral calculus. Unremarkable bladder.  Stomach/Bowel: Negative large bowel aside from retained stool and occasional diverticula. Normal retrocecal appendix (series 2, image 46). Negative terminal ileum. No dilated small bowel. Decompressed stomach. No free air, free fluid, mesenteric stranding.  Vascular/Lymphatic: Normal caliber aorta. Vascular patency is not evaluated in the absence of IV contrast.  No lymphadenopathy.  Reproductive: Negative; tampon in place.  Other: No pelvic free fluid.  Musculoskeletal: Negative.  IMPRESSION: 1. Right lower pole nephrolithiasis but no strong evidence of ureteral calculus or obstructive uropathy. Right extrarenal pelvis suspected (normal variant). 2. Normal appendix. No inflammatory process identified in the noncontrast abdomen or pelvis.   Electronically Signed By: Odessa Fleming M.D. On: 02/04/2020 03:11  I have personally reviewed the images and agree with radiologist interpretation.    Assessment & Plan:    1. Right lower pole non-obstructing nephrolithiasis  Incidental findings of a non-obstructing stone measures 3 mm in size.   Possible acute stone event resolved by the time of imaging  We discussed general stone prevention techniques including drinking plenty water with goal of producing 2.5 L urine daily, increased  citric acid intake, avoidance of high oxalate containing foods, and decreased salt intake.  Information about dietary recommendations given today.   Recommend the patient obtain a PCP.   Suspect ongoing source of pain is unrelated to the stones, likely incidental findings.  Pain is more musculoskeletal in nature.  Follow up as needed.   2. Microscopic hematuria UA today shows 3-10 RBC's and many epithelial cells-suspect contamination from menstruation Very low risk and likely contaminant No further work up is needed at this time.   Return if symptoms worsen or fail to improve.  [facility] [address] [place], [place] [address] [phone]  I, Theador Hawthorne, am acting as a scribe for Dr. Vanna Scotland.  I have reviewed the above documentation for accuracy and completeness, and I agree with the above.   Vanna Scotland, MD

## 2020-02-18 ENCOUNTER — Other Ambulatory Visit: Payer: Self-pay

## 2020-02-18 ENCOUNTER — Ambulatory Visit (INDEPENDENT_AMBULATORY_CARE_PROVIDER_SITE_OTHER): Payer: Medicaid Other | Admitting: Urology

## 2020-02-18 VITALS — BP 126/85 | HR 82 | Wt 205.0 lb

## 2020-02-18 DIAGNOSIS — N200 Calculus of kidney: Secondary | ICD-10-CM | POA: Diagnosis not present

## 2020-02-19 LAB — URINALYSIS, COMPLETE
Bilirubin, UA: NEGATIVE
Glucose, UA: NEGATIVE
Ketones, UA: NEGATIVE
Nitrite, UA: NEGATIVE
Protein,UA: NEGATIVE
Specific Gravity, UA: >1.03 — ABNORMAL HIGH (ref 1.005–1.030)
Urobilinogen, Ur: 0.2 mg/dL (ref 0.2–1.0)
pH, UA: 5.5 (ref 5.0–7.5)

## 2020-02-19 LAB — MICROSCOPIC EXAMINATION: Epithelial Cells (non renal): >10 /hpf — AB (ref 0–10)

## 2020-12-03 ENCOUNTER — Other Ambulatory Visit: Payer: Self-pay

## 2020-12-03 ENCOUNTER — Ambulatory Visit
Admission: RE | Admit: 2020-12-03 | Discharge: 2020-12-03 | Disposition: A | Discharge status: Home / Self Care | Payer: Medicaid Other | Source: Ambulatory Visit | Attending: Emergency Medicine | Admitting: Emergency Medicine

## 2020-12-03 VITALS — BP 129/84 | HR 91 | Temp 98.8°F | Resp 16 | Ht 67.0 in | Wt 215.0 lb

## 2020-12-03 DIAGNOSIS — J029 Acute pharyngitis, unspecified: Secondary | ICD-10-CM

## 2020-12-03 MED ORDER — PREDNISONE 10 MG (21) PO TBPK: ORAL_TABLET | ORAL | 0 refills | Status: DC

## 2020-12-03 NOTE — ED Triage Notes (Signed)
Pt here with C/O Sore throat went to [facility] ER was tested for Strep, Mono an Covid all was negative. Pt was put on steroids and is now 1.5 weeks out from the finish and is still in the same pain.

## 2020-12-03 NOTE — Discharge Instructions (Addendum)
Start the prednisone tomorrow and take it each morning with breakfast.  Use OTC Pepcid twice daily, 20 mg tablets,  I have made an referral to ENT to further evaluate your sore throat. They will call you to make an appointment.

## 2020-12-03 NOTE — ED Provider Notes (Signed)
[facility]    CSN: [id number] Arrival date & time: 12/03/20  1859      History   Chief Complaint No chief complaint on file.   HPI Jenna Mcgrath is a 30 y.o. female.   HPI  30 year old female here for evaluation of of sore throat.  Patient reports that she has had a constant, sharp sore throat for the last 3 weeks.  The pain is worse on the right than the left and this is associated with hoarseness.  This has not been associated with fever, runny nose nasal congestion, cough, shortness of breath.  Patient denies any sour taste in the back of her mouth though she does endorse that she gets heartburn.  Patient was evaluated 2 weeks ago at [facility] where she had a negative strep test, monotest, flu test, and COVID test.  She was prescribed a Medrol Dosepak and she reports that the steroids worked but when the steroids were finished her pain returned.  Patient is a smoker but she denies alcohol use.  Past Medical History:  Diagnosis Date   Medical history non-contributory     Patient Active Problem List   Diagnosis Date Noted   Encounter for initial prescription of Nexplanon 06/20/2017   Vitamin D deficiency 12/05/2016    Past Surgical History:  Procedure Laterality Date   TONSILLECTOMY AND ADENOIDECTOMY     VULVA SURGERY      OB History     Gravida  4   Para  2   Term  2   Preterm      AB  2   Living  2      SAB  1   IAB  1   Ectopic      Multiple  0   Live Births  2            Home Medications    Prior to Admission medications   Medication Sig Start Date End Date Taking? Authorizing Provider  predniSONE (STERAPRED UNI-PAK 21 TAB) 10 MG (21) TBPK tablet Take 6 tablets on day 1, 5 tablets day 2, 4 tablets day 3, 3 tablets day 4, 2 tablets day 5, 1 tablet day 6 12/03/20  Yes Becky Augusta, NP  tamsulosin (FLOMAX) 0.4 MG CAPS capsule Take 1 capsule (0.4 mg total) by mouth daily. 02/04/20   Irean Hong, MD    Family  History Family History  Problem Relation Age of Onset   Cancer Maternal Grandmother    Diabetes Maternal Grandmother    Diabetes Paternal Grandmother    Colon cancer Paternal Grandfather    Diabetes Mother    Diabetes Father    Hypertension Father     Social History Social History   Tobacco Use   Smoking status: Every Day    Packs/day: 2.00    Types: Cigarettes, Cigars   Smokeless tobacco: Never  Vaping Use   Vaping Use: Never used  Substance Use Topics   Alcohol use: No    Comment: Socially   Drug use: No     Allergies   Patient has no known allergies.   Review of Systems Review of Systems  Constitutional:  Negative for activity change, appetite change and fever.  HENT:  Positive for ear pain, sore throat and voice change. Negative for congestion and rhinorrhea.   Respiratory:  Negative for cough, shortness of breath and wheezing.   Gastrointestinal:  Negative for abdominal pain.  Hematological: Negative.   Psychiatric/Behavioral: Negative.  Physical Exam Triage Vital Signs ED Triage Vitals  Enc Vitals Group     BP 12/03/20 1917 129/84     Pulse Rate 12/03/20 1917 91     Resp 12/03/20 1917 16     Temp 12/03/20 1917 98.8 F (37.1 C)     Temp Source 12/03/20 1917 Oral     SpO2 12/03/20 1917 97 %     Weight 12/03/20 1916 215 lb (97.5 kg)     Height 12/03/20 1916 5\' 7"  (1.702 m)     Head Circumference --      Peak Flow --      Pain Score 12/03/20 1916 10     Pain Loc --      Pain Edu? --      Excl. in GC? --    No data found.  Updated Vital Signs BP 129/84 (BP Location: Left Arm)   Pulse 91   Temp 98.8 F (37.1 C) (Oral)   Resp 16   Ht 5\' 7"  (1.702 m)   Wt 215 lb (97.5 kg)   LMP 12/02/2020   SpO2 97%   BMI 33.67 kg/m   Visual Acuity Right Eye Distance:   Left Eye Distance:   Bilateral Distance:    Right Eye Near:   Left Eye Near:    Bilateral Near:     Physical Exam Vitals and nursing note reviewed.  Constitutional:       General: She is not in acute distress.    Appearance: Normal appearance. She is not ill-appearing.  HENT:     Head: Normocephalic and atraumatic.     Right Ear: Tympanic membrane, ear canal and external ear normal. There is no impacted cerumen.     Left Ear: Tympanic membrane, ear canal and external ear normal. There is no impacted cerumen.     Nose: Nose normal. No congestion or rhinorrhea.     Mouth/Throat:     Mouth: Mucous membranes are moist.     Pharynx: Oropharynx is clear. No posterior oropharyngeal erythema.  Cardiovascular:     Rate and Rhythm: Normal rate and regular rhythm.     Pulses: Normal pulses.     Heart sounds: Normal heart sounds. No murmur heard.   No gallop.  Pulmonary:     Effort: Pulmonary effort is normal.     Breath sounds: Normal breath sounds. No wheezing, rhonchi or rales.  Musculoskeletal:     Cervical back: Normal range of motion and neck supple.  Lymphadenopathy:     Cervical: No cervical adenopathy.  Skin:    General: Skin is warm and dry.     Capillary Refill: Capillary refill takes less than 2 seconds.     Findings: No erythema or rash.  Neurological:     General: No focal deficit present.     Mental Status: She is alert and oriented to person, place, and time.  Psychiatric:        Mood and Affect: Mood normal.        Behavior: Behavior normal.        Thought Content: Thought content normal.        Judgment: Judgment normal.     UC Treatments / Results  Labs (all labs ordered are listed, but only abnormal results are displayed) Labs Reviewed - No data to display  EKG   Radiology No results found.  Procedures Procedures (including critical care time)  Medications Ordered in UC Medications - No data to display  Initial Impression /  Assessment and Plan / UC Course  I have reviewed the triage vital signs and the nursing notes.  Pertinent labs & imaging results that were available during my care of the patient were reviewed by me  and considered in my medical decision making (see chart for details).  Patient is a pleasant, nontoxic-appearing 30 year old female here for evaluation of sore throat that has been present and constant for the last 3 weeks as outlined in HPI above.  Patient is also complaining of right ear pain.  Patient physical exam reveals pearly gray tympanic membranes bilaterally with a normal light reflex and clear external auditory canals.  Nasal mucosa is pink and moist without erythema, edema, or discharge.  Oropharyngeal exam reveals pink and moist oropharyngeal mucosa without injection, erythema, edema, or exudate.  There is an isolated edematous lymph node in the right mid anterior C-spine that patient Isabella Stalling is always there.  Patient reports that her pain is lower and it is more the level of her larynx.  When palpating her larynx she does not complain of increased pain.  Patient has had a tonsillectomy.  Patient's cardiopulmonary dam reveals clear lung sounds in all fields.  Source the patient's pharyngitis is unclear but there is concern given her hoarseness and smoking history that she may have some tracheal/laryngeal pathology at play.  We will place patient back on steroid taper to help with inflammation and pain and refer her to ENT for evaluation.  Patient also advised that this may be reflux and she can try over-the-counter Pepcid 20 mg twice daily to decrease acid production and see if it helps with her symptoms.  Strongly encouraged to quit smoking.   Final Clinical Impressions(s) / UC Diagnoses   Final diagnoses:  Acute pharyngitis, unspecified etiology     Discharge Instructions      Start the prednisone tomorrow and take it each morning with breakfast.  Use OTC Pepcid twice daily, 20 mg tablets,  I have made an referral to ENT to further evaluate your sore throat. They will call you to make an appointment.     ED Prescriptions     Medication Sig Dispense Auth. Provider   predniSONE  (STERAPRED UNI-PAK 21 TAB) 10 MG (21) TBPK tablet Take 6 tablets on day 1, 5 tablets day 2, 4 tablets day 3, 3 tablets day 4, 2 tablets day 5, 1 tablet day 6 21 tablet Becky Augusta, NP      PDMP not reviewed this encounter.   Becky Augusta, NP 12/03/20 1940

## 2021-04-28 ENCOUNTER — Other Ambulatory Visit: Payer: Self-pay

## 2021-04-28 ENCOUNTER — Ambulatory Visit
Admission: RE | Admit: 2021-04-28 | Discharge: 2021-04-28 | Disposition: A | Discharge status: Home / Self Care | Payer: Medicaid Other | Source: Ambulatory Visit | Attending: Emergency Medicine | Admitting: Emergency Medicine

## 2021-04-28 VITALS — BP 131/79 | HR 82 | Temp 98.3°F | Resp 18 | Ht 67.0 in | Wt 197.0 lb

## 2021-04-28 DIAGNOSIS — R051 Acute cough: Secondary | ICD-10-CM

## 2021-04-28 DIAGNOSIS — J111 Influenza due to unidentified influenza virus with other respiratory manifestations: Secondary | ICD-10-CM

## 2021-04-28 MED ORDER — ALBUTEROL SULFATE HFA 108 (90 BASE) MCG/ACT IN AERS: 1.0000 | INHALATION_SPRAY | Freq: Four times a day (QID) | RESPIRATORY_TRACT | 0 refills | Status: DC | PRN

## 2021-04-28 MED ORDER — PROMETHAZINE-DM 6.25-15 MG/5ML PO SYRP: 1.2500 mL | ORAL_SOLUTION | Freq: Four times a day (QID) | ORAL | 0 refills | Status: DC | PRN

## 2021-04-28 MED ORDER — PREDNISONE 10 MG (21) PO TBPK: ORAL_TABLET | ORAL | 0 refills | Status: DC

## 2021-04-28 NOTE — ED Provider Notes (Signed)
[facility]    CSN: [id number] Arrival date & time: 04/28/21  1103      History   Chief Complaint Chief Complaint  Patient presents with   Cough   Generalized Body Aches    HPI Jenna Mcgrath is a 31 y.o. female.   Patient is here for complaint of body aches chest and nasal congestion ear pain for the past 3 days states that her daughter was tested positive on Friday for the flu and she has similar symptoms.  Patient denies any fever no nausea vomiting.  Has taken DayQuil and NyQuil with minimal relief.   Past Medical History:  Diagnosis Date   Medical history non-contributory     Patient Active Problem List   Diagnosis Date Noted   Encounter for initial prescription of Nexplanon 06/20/2017   Vitamin D deficiency 12/05/2016    Past Surgical History:  Procedure Laterality Date   TONSILLECTOMY AND ADENOIDECTOMY     VULVA SURGERY      OB History     Gravida  4   Para  2   Term  2   Preterm      AB  2   Living  2      SAB  1   IAB  1   Ectopic      Multiple  0   Live Births  2            Home Medications    Prior to Admission medications   Medication Sig Start Date End Date Taking? Authorizing Provider  albuterol (VENTOLIN HFA) 108 (90 Base) MCG/ACT inhaler Inhale 1-2 puffs into the lungs every 6 (six) hours as needed for wheezing or shortness of breath. 04/28/21  Yes Coralyn Mark, NP  promethazine-dextromethorphan (PROMETHAZINE-DM) 6.25-15 MG/5ML syrup Take 1.3 mLs by mouth 4 (four) times daily as needed for cough. 04/28/21  Yes Coralyn Mark, NP  predniSONE (STERAPRED UNI-PAK 21 TAB) 10 MG (21) TBPK tablet Take 6 tablets on day 1, 5 tablets day 2, 4 tablets day 3, 3 tablets day 4, 2 tablets day 5, 1 tablet day 6 04/28/21   Coralyn Mark, NP  tamsulosin (FLOMAX) 0.4 MG CAPS capsule Take 1 capsule (0.4 mg total) by mouth daily. 02/04/20   Irean Hong, MD    Family History Family History  Problem Relation Age of  Onset   Cancer Maternal Grandmother    Diabetes Maternal Grandmother    Diabetes Paternal Grandmother    Colon cancer Paternal Grandfather    Diabetes Mother    Diabetes Father    Hypertension Father     Social History Social History   Tobacco Use   Smoking status: Every Day    Packs/day: 2.00    Types: Cigarettes, Cigars   Smokeless tobacco: Never  Vaping Use   Vaping Use: Never used  Substance Use Topics   Alcohol use: No    Comment: Socially   Drug use: No     Allergies   Patient has no known allergies.   Review of Systems Review of Systems  Constitutional:  Positive for activity change. Negative for chills and fever.  HENT:  Positive for congestion, ear pain, postnasal drip, rhinorrhea, sinus pressure, sinus pain, sneezing and sore throat.   Eyes: Negative.   Respiratory:  Positive for cough. Negative for shortness of breath.   Cardiovascular: Negative.   Gastrointestinal: Negative.   Genitourinary: Negative.   Musculoskeletal: Negative.  Generalized body aches  Skin: Negative.   Neurological: Negative.     Physical Exam Triage Vital Signs ED Triage Vitals  Enc Vitals Group     BP 04/28/21 1123 131/79     Pulse Rate 04/28/21 1123 82     Resp 04/28/21 1123 18     Temp 04/28/21 1123 98.3 F (36.8 C)     Temp Source 04/28/21 1123 Oral     SpO2 04/28/21 1123 100 %     Weight 04/28/21 1122 197 lb (89.4 kg)     Height 04/28/21 1122 5\' 7"  (1.702 m)     Head Circumference --      Peak Flow --      Pain Score 04/28/21 1122 6     Pain Loc --      Pain Edu? --      Excl. in GC? --    No data found.  Updated Vital Signs BP 131/79 (BP Location: Right Arm)    Pulse 82    Temp 98.3 F (36.8 C) (Oral)    Resp 18    Ht 5\' 7"  (1.702 m)    Wt 197 lb (89.4 kg)    LMP 04/27/2021    SpO2 100%    BMI 30.85 kg/m   Visual Acuity Right Eye Distance:   Left Eye Distance:   Bilateral Distance:    Right Eye Near:   Left Eye Near:    Bilateral Near:      Physical Exam Constitutional:      Appearance: Normal appearance. She is normal weight.  HENT:     Right Ear: Tympanic membrane normal.     Left Ear: Tympanic membrane normal.     Nose: Congestion and rhinorrhea present.     Mouth/Throat:     Mouth: Mucous membranes are moist.  Eyes:     Pupils: Pupils are equal, round, and reactive to light.  Cardiovascular:     Rate and Rhythm: Normal rate.  Pulmonary:     Effort: Pulmonary effort is normal.  Abdominal:     General: Abdomen is flat.  Musculoskeletal:        General: Normal range of motion.  Skin:    General: Skin is warm.  Neurological:     General: No focal deficit present.     Mental Status: She is alert.     UC Treatments / Results  Labs (all labs ordered are listed, but only abnormal results are displayed) Labs Reviewed - No data to display  EKG   Radiology No results found.  Procedures Procedures (including critical care time)  Medications Ordered in UC Medications - No data to display  Initial Impression / Assessment and Plan / UC Course  I have reviewed the triage vital signs and the nursing notes.  Pertinent labs & imaging results that were available during my care of the patient were reviewed by me and considered in my medical decision making (see chart for details).     Take Tylenol or Motrin as needed for fever or pain. This is more viral in nature Can continue to take the DayQuil" NyQuil as needed Stay hydrated clear liquids  Final Clinical Impressions(s) / UC Diagnoses   Final diagnoses:  Influenza-like illness  Acute cough   Discharge Instructions   None    ED Prescriptions     Medication Sig Dispense Auth. Provider   predniSONE (STERAPRED UNI-PAK 21 TAB) 10 MG (21) TBPK tablet Take 6 tablets on day 1, 5 tablets  day 2, 4 tablets day 3, 3 tablets day 4, 2 tablets day 5, 1 tablet day 6 21 tablet Maple Mirza L, NP   promethazine-dextromethorphan (PROMETHAZINE-DM) 6.25-15  MG/5ML syrup Take 1.3 mLs by mouth 4 (four) times daily as needed for cough. 118 mL Maple Mirza L, NP   albuterol (VENTOLIN HFA) 108 (90 Base) MCG/ACT inhaler Inhale 1-2 puffs into the lungs every 6 (six) hours as needed for wheezing or shortness of breath. 18 g Coralyn Mark, NP      PDMP not reviewed this encounter.   Coralyn Mark, NP 04/28/21 [phone]

## 2021-04-28 NOTE — ED Triage Notes (Signed)
Pt here with C/O body aches, chest/nasal congestion, cough, watery eyes, both ear pain, for 3 days. Pt older daughter tested positive for flu late last week.

## 2021-06-14 ENCOUNTER — Ambulatory Visit: Payer: Medicaid Other

## 2021-08-23 ENCOUNTER — Ambulatory Visit
Admission: RE | Admit: 2021-08-23 | Discharge: 2021-08-23 | Disposition: A | Discharge status: Home / Self Care | Payer: Medicaid Other | Source: Ambulatory Visit | Attending: Internal Medicine | Admitting: Internal Medicine

## 2021-08-23 VITALS — BP 131/81 | HR 87 | Temp 98.5°F | Resp 18 | Ht 67.0 in | Wt 205.0 lb

## 2021-08-23 DIAGNOSIS — H00011 Hordeolum externum right upper eyelid: Secondary | ICD-10-CM

## 2021-08-23 MED ORDER — ERYTHROMYCIN 5 MG/GM OP OINT: TOPICAL_OINTMENT | OPHTHALMIC | 0 refills | Status: DC

## 2021-08-23 NOTE — ED Provider Notes (Signed)
?[place] ? ? ? ?CSN: [id number] ?Arrival date & time: 08/23/21  1757 ? ? ?  ? ?History   ?Chief Complaint ?Chief Complaint  ?Patient presents with  ? Eye Problem  ?  Right top eyelid swollen - Entered by patient  ? ? ?HPI ?Jenna Mcgrath is a 31 y.o. female who presents with R lid swelling and pain since last night and feels worse today.  ? ? ? ?Past Medical History:  ?Diagnosis Date  ? Medical history non-contributory   ? ? ?Patient Active Problem List  ? Diagnosis Date Noted  ? Encounter for initial prescription of Nexplanon 06/20/2017  ? Vitamin D deficiency 12/05/2016  ? ? ?Past Surgical History:  ?Procedure Laterality Date  ? TONSILLECTOMY AND ADENOIDECTOMY    ? VULVA SURGERY    ? ? ?OB History   ? ? Gravida  ?4  ? Para  ?2  ? Term  ?2  ? Preterm  ?   ? AB  ?2  ? Living  ?2  ?  ? ? SAB  ?1  ? IAB  ?1  ? Ectopic  ?   ? Multiple  ?0  ? Live Births  ?2  ?   ?  ?  ? ? ? ?Home Medications   ? ?Prior to Admission medications   ?Medication Sig Start Date End Date Taking? Authorizing Provider  ?erythromycin ophthalmic ointment Place a 1/2 inch ribbon of ointment into the lower eyelid of R eye tid x 7 days 08/23/21  Yes Rodriguez-Southworth, Sunday Spillers, PA-C  ? ? ?Family History ?Family History  ?Problem Relation Age of Onset  ? Cancer Maternal Grandmother   ? Diabetes Maternal Grandmother   ? Diabetes Paternal Grandmother   ? Colon cancer Paternal Grandfather   ? Diabetes Mother   ? Diabetes Father   ? Hypertension Father   ? ? ?Social History ?Social History  ? ?Tobacco Use  ? Smoking status: Every Day  ?  Packs/day: 2.00  ?  Types: Cigarettes, Cigars  ? Smokeless tobacco: Never  ?Vaping Use  ? Vaping Use: Never used  ?Substance Use Topics  ? Alcohol use: Yes  ?  Comment: Socially  ? Drug use: No  ? ? ? ?Allergies   ?Patient has no known allergies. ? ? ?Review of Systems ?Review of Systems  ?Eyes:  Negative for photophobia, pain, discharge, redness, itching and visual disturbance.  ?     + R upper lid pain   ? ? ?Physical Exam ?Triage Vital Signs ?ED Triage Vitals  ?Enc Vitals Group  ?   BP 08/23/21 1817 131/81  ?   Pulse Rate 08/23/21 1817 87  ?   Resp 08/23/21 1817 18  ?   Temp 08/23/21 1817 98.5 ?F (36.9 ?C)  ?   Temp Source 08/23/21 1817 Oral  ?   SpO2 08/23/21 1817 99 %  ?   Weight 08/23/21 1815 205 lb (93 kg)  ?   Height 08/23/21 1815 5\' 7"  (1.702 m)  ?   Head Circumference --   ?   Peak Flow --   ?   Pain Score 08/23/21 1814 1  ?   Pain Loc --   ?   Pain Edu? --   ?   Excl. in [place]? --   ? ?No data found. ? ?Updated Vital Signs ?BP 131/81 (BP Location: Left Arm)   Pulse 87   Temp 98.5 ?F (36.9 ?C) (Oral)   Resp 18   Ht 5'  7" (1.702 m)   Wt 205 lb (93 kg)   LMP 08/23/2021   SpO2 99%   BMI 32.11 kg/m?  ? ?Visual Acuity ?Right Eye Distance:   ?Left Eye Distance:   ?Bilateral Distance:   ? ?Right Eye Near:   ?Left Eye Near:    ?Bilateral Near:    ? ?Physical Exam ?Vitals and nursing note reviewed.  ?Constitutional:   ?   General: She is not in acute distress. ?   Appearance: She is not toxic-appearing.  ?Eyes:  ?   General: No scleral icterus.    ?   Right eye: No discharge.     ?   Left eye: No discharge.  ?   Extraocular Movements: Extraocular movements intact.  ?   Conjunctiva/sclera: Conjunctivae normal.  ?   Comments: L medial upper lid with mild swelling and erythema, and moderate tenderness  ?Musculoskeletal:  ?   Cervical back: Neck supple.  ?Neurological:  ?   Mental Status: She is alert and oriented to person, place, and time.  ?   Gait: Gait normal.  ?Psychiatric:     ?   Mood and Affect: Mood normal.     ?   Behavior: Behavior normal.     ?   Thought Content: Thought content normal.     ?   Judgment: Judgment normal.  ? ? ? ?UC Treatments / Results  ?Labs ?(all labs ordered are listed, but only abnormal results are displayed) ?Labs Reviewed - No data to display ? ?EKG ? ? ?Radiology ?No results found. ? ?Procedures ?Procedures (including critical care time) ? ?Medications Ordered in UC ?Medications  - No data to display ? ?Initial Impression / Assessment and Plan / UC Course  ?I have reviewed the triage vital signs and the nursing notes. ?Sty R medial upper lid.  ? ?She was placed on Erythromycin ointment as noted. See instructions.  ?Final Clinical Impressions(s) / UC Diagnoses  ? ?Final diagnoses:  ?Hordeolum externum of right upper eyelid  ? ? ? ?Discharge Instructions   ? ?  ?Use head on upper R lid for 10-15 minutes 2-4 times a day  ? ? ? ? ?ED Prescriptions   ? ? Medication Sig Dispense Auth. Provider  ? erythromycin ophthalmic ointment Place a 1/2 inch ribbon of ointment into the lower eyelid of R eye tid x 7 days 3.5 g Rodriguez-Southworth, Sunday Spillers, PA-C  ? ?  ? ?PDMP not reviewed this encounter. ?  ?Shelby Mattocks, PA-C ?08/23/21 1831 ? ?

## 2021-08-23 NOTE — ED Triage Notes (Signed)
Pt c/o right eye pain. Pt denies any drainage or discharge. Pt has pain when closing her eyes and opening.  ? ?Pt had a cookout and is unaware if her eye swelling is from her toddler or allergies.  ?

## 2021-08-23 NOTE — Discharge Instructions (Signed)
Use head on upper R lid for 10-15 minutes 2-4 times a day  ?

## 2021-09-09 ENCOUNTER — Emergency Department
Admission: EM | Admit: 2021-09-09 | Discharge: 2021-09-09 | Disposition: A | Discharge status: Home / Self Care | Payer: Medicaid Other | Attending: Emergency Medicine | Admitting: Emergency Medicine

## 2021-09-09 ENCOUNTER — Encounter: Payer: Self-pay | Admitting: Intensive Care

## 2021-09-09 ENCOUNTER — Emergency Department: Payer: Medicaid Other

## 2021-09-09 ENCOUNTER — Other Ambulatory Visit: Payer: Self-pay

## 2021-09-09 ENCOUNTER — Ambulatory Visit: Payer: Medicaid Other

## 2021-09-09 DIAGNOSIS — R1011 Right upper quadrant pain: Secondary | ICD-10-CM | POA: Diagnosis not present

## 2021-09-09 DIAGNOSIS — R1013 Epigastric pain: Secondary | ICD-10-CM | POA: Insufficient documentation

## 2021-09-09 LAB — CBC
HCT: 40.5 % (ref 36.0–46.0)
Hemoglobin: 13.1 g/dL (ref 12.0–15.0)
MCH: 29.1 pg (ref 26.0–34.0)
MCHC: 32.3 g/dL (ref 30.0–36.0)
MCV: 90 fL (ref 80.0–100.0)
Platelets: 288 10*3/uL (ref 150–400)
RBC: 4.5 MIL/uL (ref 3.87–5.11)
RDW: 13.1 % (ref 11.5–15.5)
WBC: 6.5 10*3/uL (ref 4.0–10.5)
nRBC: 0 % (ref 0.0–0.2)

## 2021-09-09 LAB — URINALYSIS, ROUTINE W REFLEX MICROSCOPIC
Bilirubin Urine: NEGATIVE
Glucose, UA: NEGATIVE mg/dL
Hgb urine dipstick: NEGATIVE
Ketones, ur: NEGATIVE mg/dL
Leukocytes,Ua: NEGATIVE
Nitrite: NEGATIVE
Protein, ur: NEGATIVE mg/dL
Specific Gravity, Urine: 1.001 — ABNORMAL LOW (ref 1.005–1.030)
pH: 7 (ref 5.0–8.0)

## 2021-09-09 LAB — POC URINE PREG, ED: Preg Test, Ur: NEGATIVE

## 2021-09-09 LAB — COMPREHENSIVE METABOLIC PANEL
ALT: 17 U/L (ref 0–44)
AST: 20 U/L (ref 15–41)
Albumin: 3.8 g/dL (ref 3.5–5.0)
Alkaline Phosphatase: 58 U/L (ref 38–126)
Anion gap: 5 (ref 5–15)
BUN: 6 mg/dL (ref 6–20)
CO2: 23 mmol/L (ref 22–32)
Calcium: 8.6 mg/dL — ABNORMAL LOW (ref 8.9–10.3)
Chloride: 107 mmol/L (ref 98–111)
Creatinine, Ser: 0.83 mg/dL (ref 0.44–1.00)
GFR, Estimated: >60 mL/min (ref >60)
Glucose, Bld: 97 mg/dL (ref 70–99)
Potassium: 3.7 mmol/L (ref 3.5–5.1)
Sodium: 135 mmol/L (ref 135–145)
Total Bilirubin: 0.6 mg/dL (ref 0.3–1.2)
Total Protein: 7.1 g/dL (ref 6.5–8.1)

## 2021-09-09 LAB — LIPASE, BLOOD: Lipase: 26 U/L (ref 11–51)

## 2021-09-09 MED ORDER — SODIUM CHLORIDE 0.9 % IV BOLUS
1000.0000 mL | Freq: Once | INTRAVENOUS | Status: AC
  Administered 2021-09-09: 1000 mL via INTRAVENOUS

## 2021-09-09 MED ORDER — LIDOCAINE VISCOUS HCL 2 % MT SOLN
15.0000 mL | Freq: Once | OROMUCOSAL | Status: AC
  Administered 2021-09-09: 15 mL via ORAL
  Filled 2021-09-09: qty 15

## 2021-09-09 MED ORDER — KETOROLAC TROMETHAMINE 30 MG/ML IJ SOLN
30.0000 mg | Freq: Once | INTRAMUSCULAR | Status: AC
  Administered 2021-09-09: 30 mg via INTRAMUSCULAR
  Filled 2021-09-09: qty 1

## 2021-09-09 MED ORDER — OMEPRAZOLE 40 MG PO CPDR: 40.0000 mg | DELAYED_RELEASE_CAPSULE | Freq: Every day | ORAL | 1 refills | Status: DC

## 2021-09-09 MED ORDER — MORPHINE SULFATE (PF) 4 MG/ML IV SOLN
4.0000 mg | Freq: Once | INTRAVENOUS | Status: AC
  Administered 2021-09-09: 4 mg via INTRAVENOUS
  Filled 2021-09-09: qty 1

## 2021-09-09 MED ORDER — OXYCODONE-ACETAMINOPHEN 5-325 MG PO TABS
1.0000 | ORAL_TABLET | Freq: Once | ORAL | Status: AC
  Administered 2021-09-09: 1 via ORAL
  Filled 2021-09-09: qty 1

## 2021-09-09 MED ORDER — OMEPRAZOLE MAGNESIUM 20 MG PO TBEC: 20.0000 mg | DELAYED_RELEASE_TABLET | Freq: Every day | ORAL | 1 refills | Status: DC

## 2021-09-09 MED ORDER — IOHEXOL 300 MG/ML  SOLN
100.0000 mL | Freq: Once | INTRAMUSCULAR | Status: AC | PRN
  Administered 2021-09-09: 100 mL via INTRAVENOUS

## 2021-09-09 MED ORDER — ONDANSETRON 4 MG PO TBDP
4.0000 mg | ORAL_TABLET | Freq: Once | ORAL | Status: AC
  Administered 2021-09-09: 4 mg via ORAL
  Filled 2021-09-09: qty 1

## 2021-09-09 MED ORDER — ALUM & MAG HYDROXIDE-SIMETH 200-200-20 MG/5ML PO SUSP
15.0000 mL | Freq: Once | ORAL | Status: AC
  Administered 2021-09-09: 15 mL via ORAL
  Filled 2021-09-09: qty 30

## 2021-09-09 NOTE — ED Notes (Signed)
See triage note. Pt reports abd pain since Tuesday. Denies hematuria. States it feels similar to when had kidney stones. Decreased appetite.

## 2021-09-09 NOTE — ED Triage Notes (Signed)
Patient c/o right sided abdominal pain that moves to right side. Denies N/V. Pain started Tuesday night and reports lower back pain in beginning. Denies urinary symptoms. Reports kidney stone in past and feels similar

## 2021-09-09 NOTE — ED Provider Notes (Signed)
[facility] Provider Note    Event Date/Time   First MD Initiated Contact with Patient 09/09/21 1306     (approximate)   History   Chief Complaint Abdominal Pain   HPI  Geneve Kimpel is a 31 y.o. female with no significant past medical history presents to the ED complaining of abdominal pain.  Patient reports that she has been dealing with intermittent pain starting in her epigastrium and moving towards her right upper quadrant for the past 2 days.  Pain is described as sharp and stabbing, not exacerbated or alleviated by anything in particular.  It seemed to resolve on its own when it first started 2 days ago, but has been present constantly over the course of the day today.  She denies any associated nausea or vomiting, states her bowel movements have been normal.  She has not had any fevers, dysuria, hematuria, vaginal bleeding, or discharge.  She reports a history of kidney stones with similar discomfort in the past.     Physical Exam   Triage Vital Signs: ED Triage Vitals [09/09/21 1246]  Enc Vitals Group     BP 132/82     Pulse Rate 87     Resp 16     Temp 98.7 F (37.1 C)     Temp Source Oral     SpO2 97 %     Weight 196 lb (88.9 kg)     Height 5\' 7"  (1.702 m)     Head Circumference      Peak Flow      Pain Score 10     Pain Loc      Pain Edu?      Excl. in GC?     Most recent vital signs: Vitals:   09/09/21 1246 09/09/21 1554  BP: 132/82 130/78  Pulse: 87 80  Resp: 16 16  Temp: 98.7 F (37.1 C)   SpO2: 97% 98%    Constitutional: Alert and oriented. Eyes: Conjunctivae are normal. Head: Atraumatic. Nose: No congestion/rhinnorhea. Mouth/Throat: Mucous membranes are moist.  Cardiovascular: Normal rate, regular rhythm. Grossly normal heart sounds.  2+ radial pulses bilaterally. Respiratory: Normal respiratory effort.  No retractions. Lungs CTAB. Gastrointestinal: Soft and tender to palpation in the epigastrium and right upper  quadrant with no rebound or guarding.  No CVA tenderness bilaterally. No distention. Musculoskeletal: No lower extremity tenderness nor edema.  Neurologic:  Normal speech and language. No gross focal neurologic deficits are appreciated.    ED Results / Procedures / Treatments   Labs (all labs ordered are listed, but only abnormal results are displayed) Labs Reviewed  COMPREHENSIVE METABOLIC PANEL - Abnormal; Notable for the following components:      Result Value   Calcium 8.6 (*)    All other components within normal limits  URINALYSIS, ROUTINE W REFLEX MICROSCOPIC - Abnormal; Notable for the following components:   Color, Urine COLORLESS (*)    APPearance CLEAR (*)    Specific Gravity, Urine 1.001 (*)    All other components within normal limits  LIPASE, BLOOD  CBC  POC URINE PREG, ED   RADIOLOGY Right upper quadrant ultrasound reviewed and interpreted by me with no gallstones, gallbladder wall thickening, or pericholecystic fluid.  PROCEDURES:  Critical Care performed: No  Procedures   MEDICATIONS ORDERED IN ED: Medications  ketorolac (TORADOL) 30 MG/ML injection 30 mg (30 mg Intramuscular Given 09/09/21 1405)  alum & mag hydroxide-simeth (MAALOX/MYLANTA) 200-200-20 MG/5ML suspension 15 mL (15 mLs Oral Given  09/09/21 1530)    And  lidocaine (XYLOCAINE) 2 % viscous mouth solution 15 mL (15 mLs Oral Given 09/09/21 1530)  ondansetron (ZOFRAN-ODT) disintegrating tablet 4 mg (4 mg Oral Given 09/09/21 1531)  morphine (PF) 4 MG/ML injection 4 mg (4 mg Intravenous Given 09/09/21 1636)  sodium chloride 0.9 % bolus 1,000 mL (1,000 mLs Intravenous New Bag/Given 09/09/21 1635)  iohexol (OMNIPAQUE) 300 MG/ML solution 100 mL (100 mLs Intravenous Contrast Given 09/09/21 1656)     IMPRESSION / MDM / ASSESSMENT AND PLAN / ED COURSE  I reviewed the triage vital signs and the nursing notes.                              31 y.o. female with no significant past medical history who presents  to the ED complaining of intermittent pain in her epigastrium moving towards her right upper quadrant for the past 2 to 3 days.  Differential diagnosis includes, but is not limited to, biliary colic, cholecystitis, pancreatitis, hepatitis, gastritis, kidney stone, UTI, pregnancy.  Patient well-appearing and in no acute distress, vital signs unremarkable.  She does have tenderness to palpation in both her epigastrium and right upper quadrant, we will further assess with right upper quadrant ultrasound for biliary pathology.  Labs are reassuring with CBC showing no anemia or leukocytosis, BMP without electrolyte abnormality or AKI, LFTs and lipase within normal limits.  Pregnancy testing is negative and urinalysis shows no signs of infection, no hematuria noted to suggest kidney stone.  Symptoms seem very atypical for a kidney stone and we will hold off on CT imaging, treat symptomatically with IM Toradol and reassess.  Right upper quadrant ultrasound is unremarkable, however patient with minimal improvement in symptoms despite Toradol, Zofran, and GI cocktail.  CT scan was performed and negative for acute process, shows only nonobstructing right-sided kidney stone that should not result in pain.  Given reassuring work-up, suspect patient's symptoms are secondary to gastritis and she is appropriate for discharge home with outpatient follow-up.  She will be started on omeprazole and was counseled to return to the ED for new or worsening symptoms, patient agrees with plan.      FINAL CLINICAL IMPRESSION(S) / ED DIAGNOSES   Final diagnoses:  Epigastric pain     Rx / DC Orders   ED Discharge Orders          Ordered    omeprazole (PRILOSEC OTC) 20 MG tablet  Daily,   Status:  Discontinued        09/09/21 1743    omeprazole (PRILOSEC) 40 MG capsule  Daily        09/09/21 1744             Note:  This document was prepared using Dragon voice recognition software and may include unintentional  dictation errors.   Chesley Noon, MD 09/09/21 [phone]

## 2021-09-09 NOTE — ED Notes (Signed)
Sig pad not working. Pt verbalizes understanding of d/c instructions. Denies questions or concerns. Ambulatory, Nad noted

## 2021-09-16 IMAGING — CT CT RENAL STONE PROTOCOL
2 of 4 series · 16 of 46 positions shown, 18 images · non-contrast
Comparison: None.

CLINICAL DATA: 29-year-old female with right flank and abdominal
pain for several days.

EXAM:
CT ABDOMEN AND PELVIS WITHOUT CONTRAST
TECHNIQUE: Multidetector CT imaging of the abdomen and pelvis was performed
following the standard protocol without IV contrast.

[Series 2: stone full standard · axial · 0.67mm/px · z∈[-477,-67]mm · 13 of 90 slices shown, 15 images]
[im 4/90  soft-tissue]
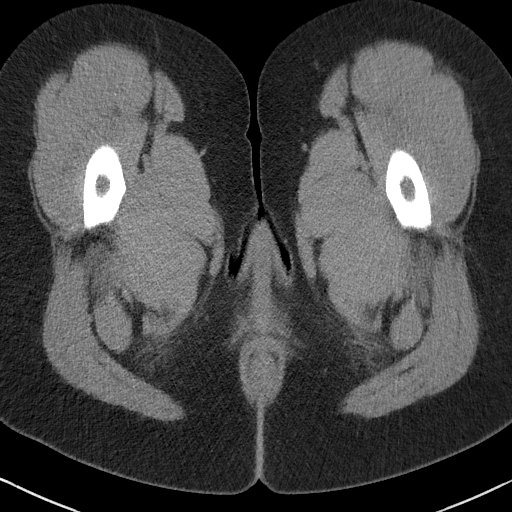
[im 4/90  bone]
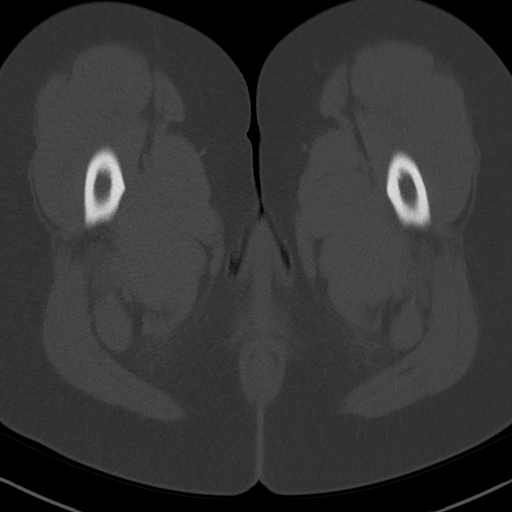
[im 12/90  soft-tissue]
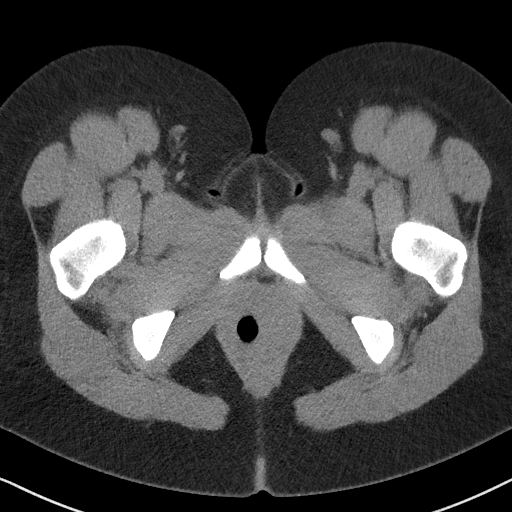
[im 20/90  soft-tissue]
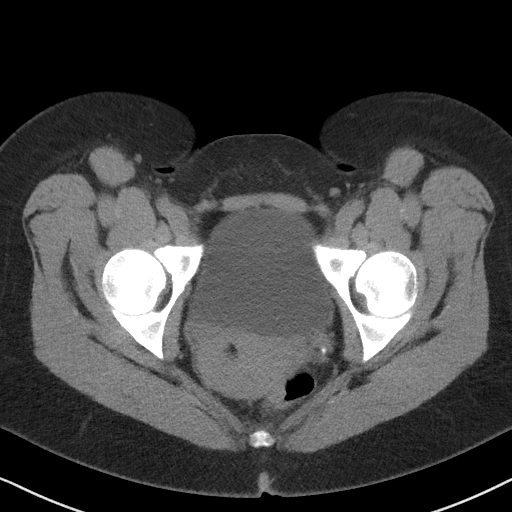
[im 24/90  soft-tissue]
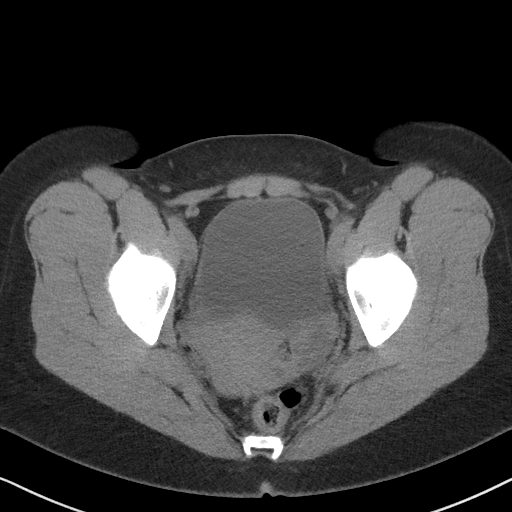
[im 31/90  soft-tissue]
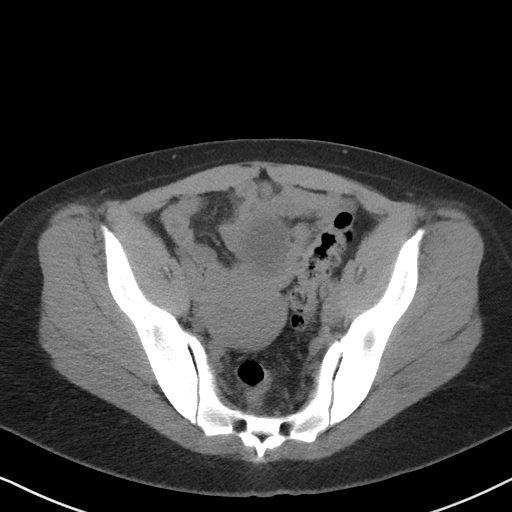
[im 39/90  soft-tissue]
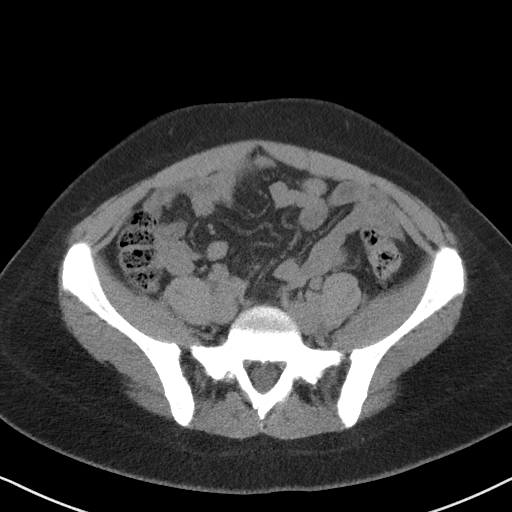
[im 47/90  soft-tissue]
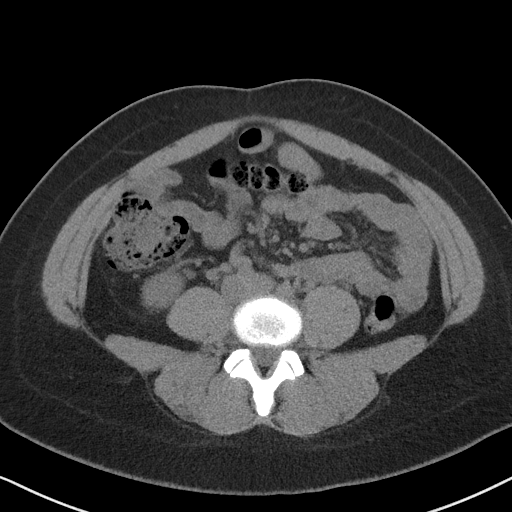
[im 51/90  soft-tissue]
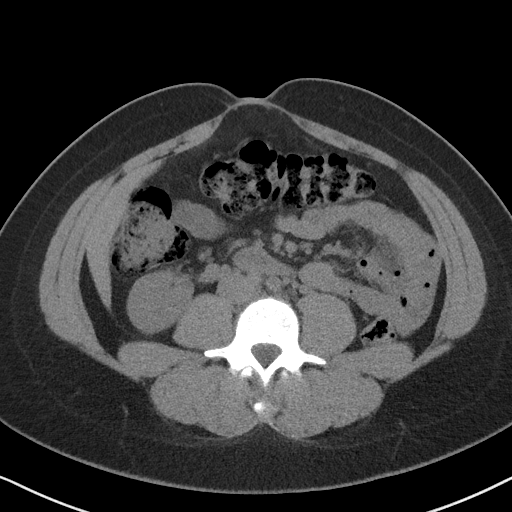
[im 59/90  soft-tissue]
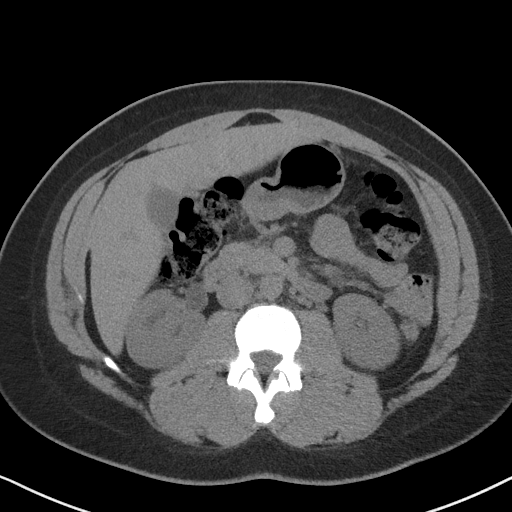
[im 59/90  bone]
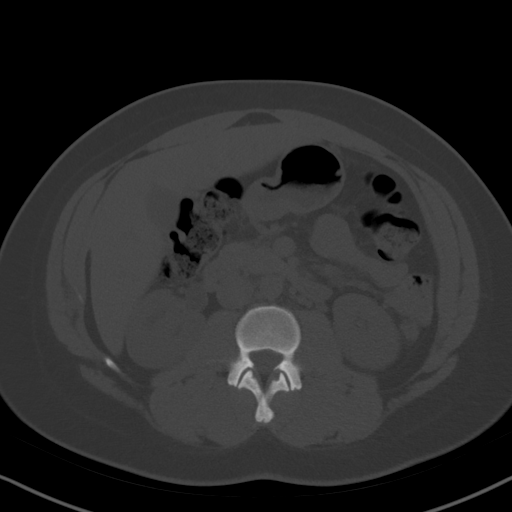
[im 66/90  soft-tissue]
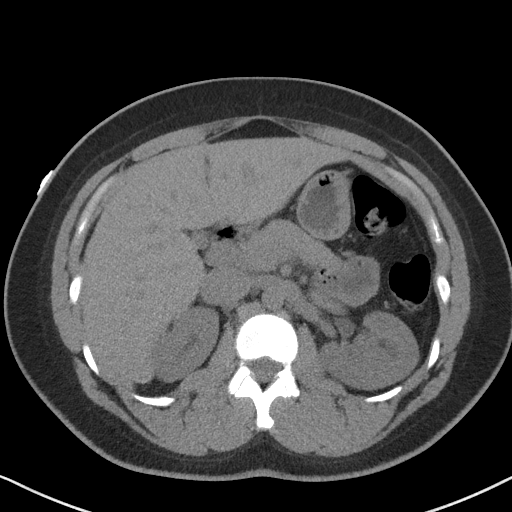
[im 70/90  soft-tissue]
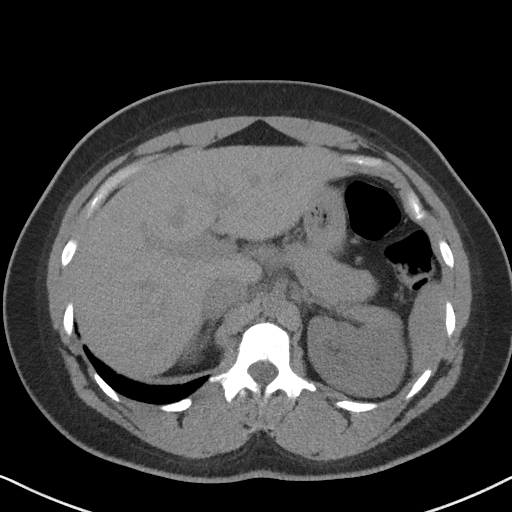
[im 78/90  soft-tissue]
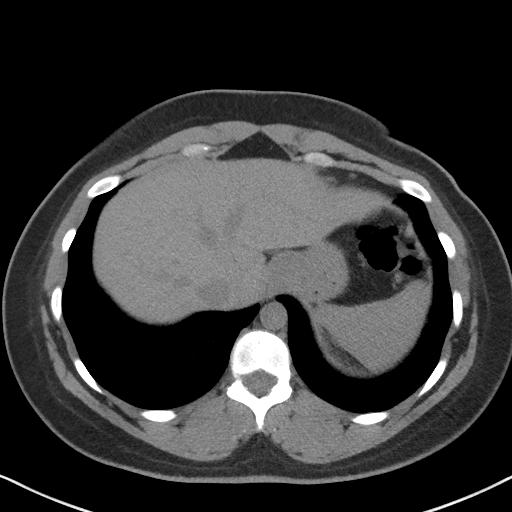
[im 86/90  soft-tissue]
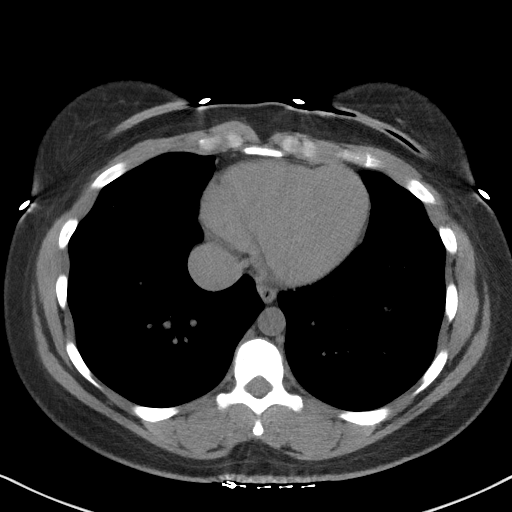

[Series 5: coronal · coronal · 0.83mm/px · 3 of 137 slices shown]
[im 46/137  soft-tissue]
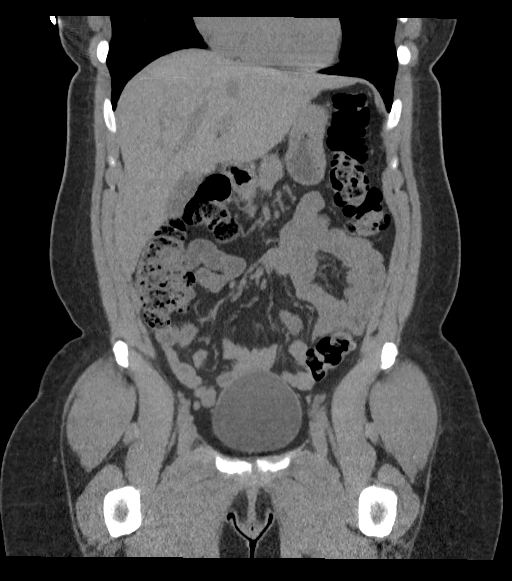
[im 61/137  soft-tissue]
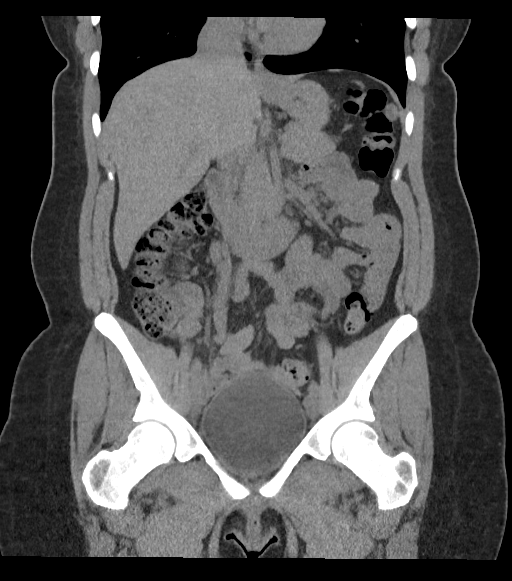
[im 76/137  soft-tissue]
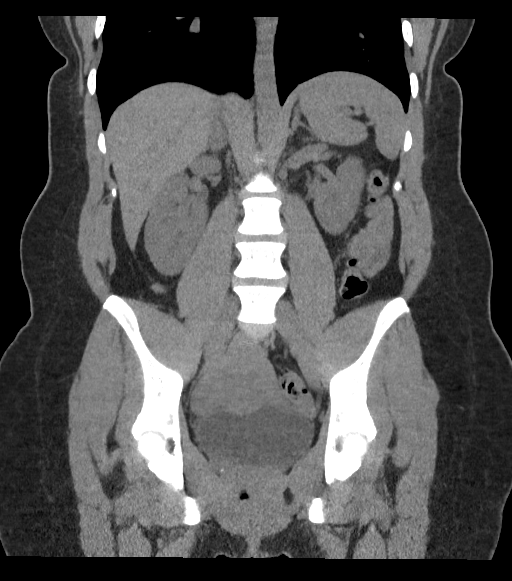

[16 of 46 positions shown; findings below may reference images not displayed]

FINDINGS: Lower chest: Negative.

Hepatobiliary: Negative noncontrast liver and gallbladder.

Pancreas: Negative.

Spleen: Negative; several punctate calcified granulomas.

Adrenals/Urinary Tract: Normal adrenal glands.

3-4 mm right lower pole renal calculus. No definite left
nephrolithiasis. No perinephric stranding on either side.

Mild right hydronephrosis versus extrarenal pelvis (normal variant).
And the right ureter tapers quickly with no periureteral stranding.

Both ureters are difficult to delineate. There are bilateral pelvic
phleboliths. No strong evidence of ureteral calculus. Unremarkable
bladder.

Stomach/Bowel: Negative large bowel aside from retained stool and
occasional diverticula. Normal retrocecal appendix (series 2, image
46). Negative terminal ileum. No dilated small bowel. Decompressed
stomach. No free air, free fluid, mesenteric stranding.

Vascular/Lymphatic: Normal caliber aorta. Vascular patency is not
evaluated in the absence of IV contrast.

No lymphadenopathy.

Reproductive: Negative; tampon in place.

Other: No pelvic free fluid.

Musculoskeletal: Negative.
IMPRESSION: 1. Right lower pole nephrolithiasis but no strong evidence of
ureteral calculus or obstructive uropathy. Right extrarenal pelvis
suspected (normal variant).
2. Normal appendix. No inflammatory process identified in the
noncontrast abdomen or pelvis.

## 2021-11-02 ENCOUNTER — Other Ambulatory Visit: Payer: Self-pay

## 2021-11-02 ENCOUNTER — Encounter: Payer: Self-pay | Admitting: *Deleted

## 2021-11-02 DIAGNOSIS — Z5321 Procedure and treatment not carried out due to patient leaving prior to being seen by health care provider: Secondary | ICD-10-CM | POA: Diagnosis not present

## 2021-11-02 DIAGNOSIS — Z3A10 10 weeks gestation of pregnancy: Secondary | ICD-10-CM | POA: Insufficient documentation

## 2021-11-02 DIAGNOSIS — R55 Syncope and collapse: Secondary | ICD-10-CM | POA: Insufficient documentation

## 2021-11-02 DIAGNOSIS — O219 Vomiting of pregnancy, unspecified: Secondary | ICD-10-CM | POA: Diagnosis present

## 2021-11-02 DIAGNOSIS — O26891 Other specified pregnancy related conditions, first trimester: Secondary | ICD-10-CM | POA: Insufficient documentation

## 2021-11-02 LAB — BASIC METABOLIC PANEL
Anion gap: 7 (ref 5–15)
BUN: 8 mg/dL (ref 6–20)
CO2: 23 mmol/L (ref 22–32)
Calcium: 9.2 mg/dL (ref 8.9–10.3)
Chloride: 104 mmol/L (ref 98–111)
Creatinine, Ser: 0.63 mg/dL (ref 0.44–1.00)
GFR, Estimated: >60 mL/min (ref >60)
Glucose, Bld: 102 mg/dL — ABNORMAL HIGH (ref 70–99)
Potassium: 3.3 mmol/L — ABNORMAL LOW (ref 3.5–5.1)
Sodium: 134 mmol/L — ABNORMAL LOW (ref 135–145)

## 2021-11-02 LAB — CBC
HCT: 36.6 % (ref 36.0–46.0)
Hemoglobin: 12.2 g/dL (ref 12.0–15.0)
MCH: 29.1 pg (ref 26.0–34.0)
MCHC: 33.3 g/dL (ref 30.0–36.0)
MCV: 87.4 fL (ref 80.0–100.0)
Platelets: 348 10*3/uL (ref 150–400)
RBC: 4.19 MIL/uL (ref 3.87–5.11)
RDW: 12.7 % (ref 11.5–15.5)
WBC: 11.2 10*3/uL — ABNORMAL HIGH (ref 4.0–10.5)
nRBC: 0 % (ref 0.0–0.2)

## 2021-11-02 LAB — TROPONIN I (HIGH SENSITIVITY): Troponin I (High Sensitivity): 3 ng/L (ref <18)

## 2021-11-02 LAB — POC URINE PREG, ED: Preg Test, Ur: POSITIVE — AB

## 2021-11-02 LAB — HCG, QUANTITATIVE, PREGNANCY: hCG, Beta Chain, Quant, S: 103908 m[IU]/mL — ABNORMAL HIGH (ref <5)

## 2021-11-02 NOTE — ED Notes (Signed)
Poct pregnancy Positive 

## 2021-11-02 NOTE — ED Triage Notes (Addendum)
Pt is approx [redacted] weeks pregnant.  Pt reports vomiting all day.  Tonight, pt states she passed out   no vag bleeding.  Denies urinary sx.   Pt alert  speech clear

## 2021-11-03 ENCOUNTER — Emergency Department
Admission: EM | Admit: 2021-11-03 | Discharge: 2021-11-03 | Discharge status: Against Medical Advice | Payer: Medicaid Other | Attending: Emergency Medicine | Admitting: Emergency Medicine

## 2021-11-03 LAB — URINALYSIS, ROUTINE W REFLEX MICROSCOPIC
Bilirubin Urine: NEGATIVE
Glucose, UA: NEGATIVE mg/dL
Hgb urine dipstick: NEGATIVE
Ketones, ur: NEGATIVE mg/dL
Nitrite: NEGATIVE
Protein, ur: NEGATIVE mg/dL
Specific Gravity, Urine: 1.004 — ABNORMAL LOW (ref 1.005–1.030)
pH: 7 (ref 5.0–8.0)

## 2021-11-03 NOTE — ED Notes (Signed)
No answer when called several times from lobby; no answer when phone # listed in chart called 

## 2021-11-03 NOTE — ED Notes (Signed)
No answer when called several times from lobby 

## 2021-11-05 ENCOUNTER — Emergency Department
Admission: EM | Admit: 2021-11-05 | Discharge: 2021-11-05 | Disposition: A | Discharge status: Home / Self Care | Payer: Medicaid Other | Attending: Emergency Medicine | Admitting: Emergency Medicine

## 2021-11-05 ENCOUNTER — Other Ambulatory Visit: Payer: Self-pay

## 2021-11-05 ENCOUNTER — Emergency Department: Payer: Medicaid Other

## 2021-11-05 DIAGNOSIS — M545 Low back pain, unspecified: Secondary | ICD-10-CM | POA: Diagnosis not present

## 2021-11-05 DIAGNOSIS — M25511 Pain in right shoulder: Secondary | ICD-10-CM | POA: Diagnosis not present

## 2021-11-05 DIAGNOSIS — Y92002 Bathroom of unspecified non-institutional (private) residence single-family (private) house as the place of occurrence of the external cause: Secondary | ICD-10-CM | POA: Diagnosis not present

## 2021-11-05 DIAGNOSIS — O9A211 Injury, poisoning and certain other consequences of external causes complicating pregnancy, first trimester: Secondary | ICD-10-CM | POA: Insufficient documentation

## 2021-11-05 DIAGNOSIS — K5900 Constipation, unspecified: Secondary | ICD-10-CM | POA: Diagnosis not present

## 2021-11-05 DIAGNOSIS — O99611 Diseases of the digestive system complicating pregnancy, first trimester: Secondary | ICD-10-CM | POA: Insufficient documentation

## 2021-11-05 DIAGNOSIS — R519 Headache, unspecified: Secondary | ICD-10-CM | POA: Insufficient documentation

## 2021-11-05 DIAGNOSIS — O210 Mild hyperemesis gravidarum: Secondary | ICD-10-CM | POA: Insufficient documentation

## 2021-11-05 DIAGNOSIS — O99011 Anemia complicating pregnancy, first trimester: Secondary | ICD-10-CM | POA: Insufficient documentation

## 2021-11-05 DIAGNOSIS — W1839XA Other fall on same level, initial encounter: Secondary | ICD-10-CM | POA: Insufficient documentation

## 2021-11-05 DIAGNOSIS — R55 Syncope and collapse: Secondary | ICD-10-CM

## 2021-11-05 DIAGNOSIS — R8271 Bacteriuria: Secondary | ICD-10-CM

## 2021-11-05 DIAGNOSIS — R1110 Vomiting, unspecified: Secondary | ICD-10-CM

## 2021-11-05 LAB — URINALYSIS, COMPLETE (UACMP) WITH MICROSCOPIC
Bilirubin Urine: NEGATIVE
Glucose, UA: NEGATIVE mg/dL
Hgb urine dipstick: NEGATIVE
Ketones, ur: NEGATIVE mg/dL
Leukocytes,Ua: NEGATIVE
Nitrite: NEGATIVE
Protein, ur: NEGATIVE mg/dL
Specific Gravity, Urine: 1.02 (ref 1.005–1.030)
pH: 6 (ref 5.0–8.0)

## 2021-11-05 LAB — COMPREHENSIVE METABOLIC PANEL
ALT: 15 U/L (ref 0–44)
AST: 19 U/L (ref 15–41)
Albumin: 3.4 g/dL — ABNORMAL LOW (ref 3.5–5.0)
Alkaline Phosphatase: 56 U/L (ref 38–126)
Anion gap: 7 (ref 5–15)
BUN: 8 mg/dL (ref 6–20)
CO2: 24 mmol/L (ref 22–32)
Calcium: 9 mg/dL (ref 8.9–10.3)
Chloride: 104 mmol/L (ref 98–111)
Creatinine, Ser: 0.7 mg/dL (ref 0.44–1.00)
GFR, Estimated: >60 mL/min (ref >60)
Glucose, Bld: 75 mg/dL (ref 70–99)
Potassium: 3.5 mmol/L (ref 3.5–5.1)
Sodium: 135 mmol/L (ref 135–145)
Total Bilirubin: 0.3 mg/dL (ref 0.3–1.2)
Total Protein: 7 g/dL (ref 6.5–8.1)

## 2021-11-05 LAB — CBC
HCT: 35.3 % — ABNORMAL LOW (ref 36.0–46.0)
Hemoglobin: 11.5 g/dL — ABNORMAL LOW (ref 12.0–15.0)
MCH: 29 pg (ref 26.0–34.0)
MCHC: 32.6 g/dL (ref 30.0–36.0)
MCV: 89.1 fL (ref 80.0–100.0)
Platelets: 320 10*3/uL (ref 150–400)
RBC: 3.96 MIL/uL (ref 3.87–5.11)
RDW: 12.9 % (ref 11.5–15.5)
WBC: 8.3 10*3/uL (ref 4.0–10.5)
nRBC: 0 % (ref 0.0–0.2)

## 2021-11-05 MED ORDER — LACTATED RINGERS IV BOLUS
1000.0000 mL | Freq: Once | INTRAVENOUS | Status: AC
  Administered 2021-11-05: 1000 mL via INTRAVENOUS

## 2021-11-05 MED ORDER — PYRIDOXINE HCL 100 MG/ML IJ SOLN
100.0000 mg | Freq: Once | INTRAMUSCULAR | Status: AC
  Administered 2021-11-05: 100 mg via INTRAVENOUS
  Filled 2021-11-05 (×2): qty 1

## 2021-11-05 MED ORDER — SODIUM CHLORIDE 0.9 % IV SOLN
12.5000 mg | Freq: Four times a day (QID) | INTRAVENOUS | Status: DC | PRN
  Administered 2021-11-05: 12.5 mg via INTRAVENOUS
  Filled 2021-11-05: qty 12.5

## 2021-11-05 MED ORDER — CEPHALEXIN 500 MG PO CAPS: 500.0000 mg | ORAL_CAPSULE | Freq: Four times a day (QID) | ORAL | 0 refills | Status: AC

## 2021-11-05 MED ORDER — SUCRALFATE 1 GM/10ML PO SUSP: 1.0000 g | Freq: Four times a day (QID) | ORAL | 0 refills | Status: DC

## 2021-11-05 MED ORDER — METOCLOPRAMIDE HCL 5 MG/ML IJ SOLN
10.0000 mg | Freq: Once | INTRAMUSCULAR | Status: AC
  Administered 2021-11-05: 10 mg via INTRAVENOUS
  Filled 2021-11-05: qty 2

## 2021-11-05 MED ORDER — ACETAMINOPHEN 500 MG PO TABS
1000.0000 mg | ORAL_TABLET | Freq: Once | ORAL | Status: AC
Start: 2021-11-05 — End: 2021-11-05
  Administered 2021-11-05: 1000 mg via ORAL
  Filled 2021-11-05: qty 2

## 2021-11-05 MED ORDER — SUCRALFATE 1 GM/10ML PO SUSP
1.0000 g | ORAL | Status: AC
Start: 2021-11-05 — End: 2021-11-05
  Administered 2021-11-05: 1 g via ORAL
  Filled 2021-11-05: qty 10

## 2021-11-05 MED ORDER — PANTOPRAZOLE SODIUM 40 MG IV SOLR
40.0000 mg | Freq: Once | INTRAVENOUS | Status: AC
  Administered 2021-11-05: 40 mg via INTRAVENOUS
  Filled 2021-11-05: qty 10

## 2021-11-05 MED ORDER — ALUM & MAG HYDROXIDE-SIMETH 200-200-20 MG/5ML PO SUSP
30.0000 mL | Freq: Once | ORAL | Status: AC
  Administered 2021-11-05: 30 mL via ORAL
  Filled 2021-11-05: qty 30

## 2021-11-05 MED ORDER — ONDANSETRON HCL 4 MG/2ML IJ SOLN
4.0000 mg | Freq: Once | INTRAMUSCULAR | Status: AC
  Administered 2021-11-05: 4 mg via INTRAVENOUS
  Filled 2021-11-05: qty 2

## 2021-11-05 NOTE — ED Triage Notes (Signed)
Pt states she is [redacted] weeks pregnant and has been having issues with hyperemesis during pregnancy, states she had a syncopal episode 2 days ago and came here but LWBS

## 2021-11-05 NOTE — ED Provider Notes (Signed)
[facility] Provider Note    Event Date/Time   First MD Initiated Contact with Patient 11/05/21 1145     (approximate)   History   Emesis During Pregnancy and Loss of Consciousness   HPI  Jenna Mcgrath is a 31 y.o. female  G6 per patient although on review of records she was noted to be "[id number]" with her OB on 7/6 with a history of previous hyperemesis gravidarum but no other significant past medical history who presents for evaluation of 2 concerns.  She states that she was initially prescribed a nausea medicine on her visit on 7/6 that she thinks was Phenergan but has not been able to keep this down.  She went to emergency room on 7/9 and was prescribed Zofran and Reglan as well as Unisom and B6 but also feels that these have not helped and she is still had several episodes of vomiting most days.  She also states that about 2 days ago she passed out and fell in her bathroom.  She states she hit her head and she thinks her right shoulder.  She has pain in her right side of her head and some discomfort in her right shoulder that radiates across to the left shoulder.  No neck pain, mid back pain or other significant extremity pain.  She does have some mild lower back pain.  She has not had any new fevers, cough, urinary symptoms or abdominal pain or vaginal bleeding.  She reports constipation.  She has been taking fiber but no other medications to help move her bowels.  Denies tobacco abuse or EtOH use or any recent trauma or injuries.  No chest pain.   Past Medical History:  Diagnosis Date   Medical history non-contributory          Physical Exam  Triage Vital Signs: ED Triage Vitals [11/05/21 1133]  Enc Vitals Group     BP 132/78     Pulse Rate 82     Resp 20     Temp 98.2 F (36.8 C)     Temp Source Oral     SpO2 100 %     Weight 205 lb (93 kg)     Height 5\' 7"  (1.702 m)     Head Circumference      Peak Flow      Pain Score      Pain Loc       Pain Edu?      Excl. in GC?     Most recent vital signs: Vitals:   11/05/21 1133  BP: 132/78  Pulse: 82  Resp: 20  Temp: 98.2 F (36.8 C)  SpO2: 100%    General: Awake, no distress.  CV:  Good peripheral perfusion.  2+ radial pulse. Resp:  Normal effort.  Clear bilaterally. Abd:  Soft. Other:  No pronator drift.  No finger dysmetria.  Some mild tenderness of the bilateral paralumbar muscles but no midline tenderness over the C/T or L-spine.  Patient is tender over the right scalp.  She also has pain on ranging the right shoulder and mild tenderness throughout the right shoulder without large effusion or edema.  She otherwise is neurovascularly intact with symmetric strength and no significant pain in all extremities.  Her abdomen is soft.  Chest is unremarkable.   ED Results / Procedures / Treatments  Labs (all labs ordered are listed, but only abnormal results are displayed) Labs Reviewed  CBC - Abnormal; Notable for the following  components:      Result Value   Hemoglobin 11.5 (*)    HCT 35.3 (*)    All other components within normal limits  COMPREHENSIVE METABOLIC PANEL - Abnormal; Notable for the following components:   Albumin 3.4 (*)    All other components within normal limits  URINALYSIS, COMPLETE (UACMP) WITH MICROSCOPIC - Abnormal; Notable for the following components:   Color, Urine YELLOW (*)    APPearance CLOUDY (*)    Bacteria, UA RARE (*)    All other components within normal limits  URINE CULTURE  CBG MONITORING, ED     EKG  ECG is remarkable for sinus rhythm with ventricular rate of 88, normal axis with some artifact in leads aVL and aVF and lead III versus nonspecific change without other clear evidence of acute ischemia or significant arrhythmia.   RADIOLOGY  CT head on my interpretation without evidence of ventricular hemorrhage, skull fracture or other acute process.  I also reviewed radiology interpretation and agree with the findings.  X-ray  of the right shoulder on my interpretation without evidence of acute fracture dislocation.  I reviewed radiology interpretation and agree to findings of no acute process and calcified lymph nodes in the right hilar region which I advised patient have and she can follow-up with her PCP with regards to this.   PROCEDURES:  Critical Care performed: No  Procedures    MEDICATIONS ORDERED IN ED: Medications  promethazine (PHENERGAN) 12.5 mg in sodium chloride 0.9 % 50 mL IVPB (12.5 mg Intravenous New Bag/Given 11/05/21 1418)  alum & mag hydroxide-simeth (MAALOX/MYLANTA) 200-200-20 MG/5ML suspension 30 mL (has no administration in time range)  sucralfate (CARAFATE) 1 GM/10ML suspension 1 g (has no administration in time range)  pantoprazole (PROTONIX) injection 40 mg (has no administration in time range)  pyridOXINE (B-6) injection 100 mg (100 mg Intravenous Given 11/05/21 1413)  ondansetron (ZOFRAN) injection 4 mg (4 mg Intravenous Given 11/05/21 1349)  acetaminophen (TYLENOL) tablet 1,000 mg (1,000 mg Oral Given 11/05/21 1447)  lactated ringers bolus 1,000 mL (1,000 mLs Intravenous New Bag/Given 11/05/21 1347)  metoCLOPramide (REGLAN) injection 10 mg (10 mg Intravenous Given 11/05/21 1446)     IMPRESSION / MDM / ASSESSMENT AND PLAN / ED COURSE  I reviewed the triage vital signs and the nursing notes. Patient's presentation is most consistent with acute presentation with potential threat to life or bodily function.                               With regard to patient's syncopal episode 2 days ago differential considerations include vasovagal, orthostatic in the setting of dehydration from nausea and vomiting and decreased p.o. intake, arrhythmia, anemia.  She has no new abdominal pain or bleeding and has a confirmed IUP and I am less concerned for abruption, hemorrhage or ruptured ectopic.  She has no focal deficits or other findings to suggest a stroke.  She does no complaints of shortness of  breath or chest pain or other abnormal vitals to suggest a PE at this time.  ECG is remarkable for sinus rhythm with ventricular rate of 88, normal axis with some artifact in leads aVL and aVF and lead III versus nonspecific change without other clear evidence of acute ischemia or significant arrhythmia.  CT head on my interpretation without evidence of ventricular hemorrhage, skull fracture or other acute process.  I also reviewed radiology interpretation and agree with the findings.  X-ray of the right shoulder on my interpretation without evidence of acute fracture dislocation.  I reviewed radiology interpretation and agree to findings of no acute process and calcified lymph nodes in the right hilar region which I advised patient have and she can follow-up with her PCP with regards to this.  I have low suspicion at this time for other significant visceral injury or trauma related to recent syncope and fall.  With regard to her ongoing nausea and vomiting I suspect this may be related to hyperemesis that she is experienced before.  Additional considerations include infectious gastritis, symptomatic constipation, UTI and possibly metabolic derangements including kidney injury.  CMP shows no significant lecture light or metabolic derangements.  No evidence of hepatitis or cholestatic process.  CBC without leukocytosis hemoglobin of 11.5 compared to 12.2 to 3 days ago with normal platelets.  Low suspicion for symptomatic anemia at this time.  UA has some bacteria but otherwise does not appear significantly infected.  We will treat with a short course of Keflex for bacteriuria in pregnancy.  Patient is feeling better after receiving several IV antibiotics.  She is able tolerate some p.o.  She states she has her previous prescribed antiemetics at home and does not really refills of these.  We will add some Carafate.  Was given some antacids as well for possible reflux symptoms.  Advised her to follow-up  with her OB.  Discharged in stable condition.  Strict return precautions advised and discussed.      FINAL CLINICAL IMPRESSION(S) / ED DIAGNOSES   Final diagnoses:  Syncope and collapse  Hyperemesis  Bacteriuria     Rx / DC Orders   ED Discharge Orders          Ordered    cephALEXin (KEFLEX) 500 MG capsule  4 times daily        11/05/21 1532    sucralfate (CARAFATE) 1 GM/10ML suspension  4 times daily        11/05/21 1532             Note:  This document was prepared using Dragon voice recognition software and may include unintentional dictation errors.   Gilles Chiquito, MD 11/05/21 1535

## 2021-11-05 NOTE — ED Notes (Signed)
Patient ambulated with a steady gait to hallway bathroom. 

## 2021-11-05 NOTE — ED Notes (Signed)
Pt given limon lime drink

## 2021-11-07 LAB — URINE CULTURE: Culture: >=100000 — AB

## 2021-11-16 DIAGNOSIS — Z3492 Encounter for supervision of normal pregnancy, unspecified, second trimester: Secondary | ICD-10-CM

## 2021-11-16 HISTORY — DX: Encounter for supervision of normal pregnancy, unspecified, second trimester: Z34.92

## 2021-11-28 LAB — OB RESULTS CONSOLE RUBELLA ANTIBODY, IGM: Rubella: IMMUNE

## 2021-11-28 LAB — OB RESULTS CONSOLE RPR: RPR: NONREACTIVE

## 2021-11-28 LAB — OB RESULTS CONSOLE HEPATITIS B SURFACE ANTIGEN: Hepatitis B Surface Ag: NEGATIVE

## 2021-11-28 LAB — OB RESULTS CONSOLE VARICELLA ZOSTER ANTIBODY, IGG: Varicella: IMMUNE

## 2021-11-28 LAB — OB RESULTS CONSOLE HIV ANTIBODY (ROUTINE TESTING): HIV: NONREACTIVE

## 2021-12-30 ENCOUNTER — Ambulatory Visit: Payer: Medicaid Other | Admitting: Physical Therapy

## 2021-12-30 DIAGNOSIS — M6281 Muscle weakness (generalized): Secondary | ICD-10-CM | POA: Insufficient documentation

## 2021-12-30 DIAGNOSIS — R293 Abnormal posture: Secondary | ICD-10-CM | POA: Insufficient documentation

## 2021-12-30 DIAGNOSIS — R102 Pelvic and perineal pain: Secondary | ICD-10-CM | POA: Insufficient documentation

## 2022-01-06 ENCOUNTER — Ambulatory Visit: Payer: Medicaid Other | Admitting: Physical Therapy

## 2022-01-10 ENCOUNTER — Encounter: Payer: Self-pay | Admitting: Physical Therapy

## 2022-01-10 ENCOUNTER — Ambulatory Visit: Payer: Medicaid Other | Admitting: Physical Therapy

## 2022-01-10 DIAGNOSIS — R293 Abnormal posture: Secondary | ICD-10-CM

## 2022-01-10 DIAGNOSIS — R102 Pelvic and perineal pain: Secondary | ICD-10-CM

## 2022-01-10 DIAGNOSIS — M6281 Muscle weakness (generalized): Secondary | ICD-10-CM

## 2022-01-10 NOTE — Therapy (Signed)
OUTPATIENT PHYSICAL THERAPY FEMALE PELVIC EVALUATION   Patient Name: Jenna Mcgrath MRN: [id number] DOB:1991-04-05, 31 y.o., female Today's Date: 01/10/2022   PT End of Session - 01/10/22 1344     Visit Number 1    Number of Visits 12    Date for PT Re-Evaluation 04/04/22    Authorization Type IE 01/10/2022    PT Start Time 1345    PT Stop Time 1425    PT Time Calculation (min) 40 min    Activity Tolerance Patient tolerated treatment well    Behavior During Therapy [facility] for tasks assessed/performed             Past Medical History:  Diagnosis Date   Medical history non-contributory    Past Surgical History:  Procedure Laterality Date   TONSILLECTOMY AND ADENOIDECTOMY     VULVA SURGERY     Patient Active Problem List   Diagnosis Date Noted   Encounter for initial prescription of Nexplanon 06/20/2017   Vitamin D deficiency 12/05/2016    PCP: Patient, No Pcp Per  REFERRING PROVIDER: Minda Meo, CNM  REFERRING DIAG: [phone] (ICD-10-CM) - Other specified pregnancy related conditions, second trimester M25.559 (ICD-10-CM) - Pain in unspecified hip M54.10 (ICD-10-CM) - Radiculopathy, site unspecified   THERAPY DIAG:  Pelvic pain  Abnormal posture  Muscle weakness (generalized)  Rationale for Evaluation and Treatment Rehabilitation  PRECAUTIONS: None  WEIGHT BEARING RESTRICTIONS No  FALLS:  Has patient fallen in last 6 months? No  ONSET DATE: 09/2021 (most recent)  SUBJECTIVE:                                                                                                                                                                                           CHIEF COMPLAINT: Patient states that the pain started about 3 months ago and occurred during last pregnancy. Patient notes that showering creates more pain. Patient notes that with increased bending "it slips something" in the back. Patient then feels stuck.   Patient has tried some stretches including:  figure 4, modified down dog, pillows between knees and under side. Notes these do not work, but will relieve tension/pressure for the moment. Patient has not tried a belly band. Patient is also looking into chiropractic care.    PERTINENT HISTORY/CHART REVIEW:  Red flags (bowel/bladder changes, saddle paresthesia, personal history of cancer, h/o spinal tumors, h/o compression fx, h/o abdominal aneurysm, abdominal pain, chills/fever, night sweats, nausea, vomiting, unrelenting pain, first onset of insidious LBP <20 y/o): Negative  16 week OB appointment: "Patient concerns today: having more hip pain in pregnancy, reports history of radiculopathy and feels like pain is  getting worse"    PAIN:  Are you having pain? 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    GASTROINTESTINAL HISTORY: Type of bowel movement: (Bristol Stool Scale) Type 1-2; 4 Frequency of BMs: 1x/day (baseline); every other day currently Pain with defecation: Positive for along colon Straining with defecation: Positive Hemorrhoids: Positive ; external; active Toileting posture: feet flat    OBJECTIVE:  DIAGNOSTIC  TESTING/IMAGING: N/a  COGNITION:  Patient is oriented to person, place, and time.  Recent memory is intact.  Remote memory is intact.  Attention span and concentration are intact.  Expressive speech is intact.  Patient's fund of knowledge is within normal limits for educational level.    POSTURE/OBSERVATIONS:   Patient sits with anteriorly tilted pelvis and WBOS. Patient sits at edge of seat and utilizes bony congruency for stability. Lumbar lordosis: increased Thoracic kyphosis: WFL Iliac crest height: symmetrical Lumbar lateral shift: negative Pelvic obliquity: symmetrical Leg length discrepancy: not formally assessed    GAIT:  Increased hip flexion B with hand over L posterior hip. Grossly WFL.    RANGE OF MOTION:   AROM (Normal range in degrees) AROM  01/10/2022  Lumbar   Flexion (65)  To midthigh, painful  Extension (30) WNL  Right lateral flexion (25) WNL  Left lateral flexion (25) WNL  Right rotation (30) WNL  Left rotation (30) WNL      Hip LEFT RIGHT  Flexion (125)    Extension (15)    Abduction (40)    Adduction     Internal Rotation (45)    External Rotation (45)    (* = pain; blank rows = not tested)   SENSATION: deferred 2/2 to time constraints  Grossly intact to light touch bilateral LEs as determined by testing dermatomes L2-S2 Proprioception and hot/cold testing deferred on this date   STRENGTH: MMT deferred 2/2 to time constraints   RLE LLE  Hip Flexion    Hip Extension    Hip Abduction     Hip Adduction     Hip ER     Hip IR     Knee Extension    Knee Flexion    Dorsiflexion     Plantarflexion (seated)    (* = pain; blank rows = not tested)   MUSCLE LENGTH: deferred 2/2 to time constraints   ABDOMINAL:   Palpation: Diastasis: apparent with lumbar extension testing, most notable at umbilicus Scar mobility: Rib flare:   SPECIAL TESTS: deferred 2/2 to time constraints     PALPATION: deferred 2/2 to time  constraints  LOCATION LEFT  RIGHT           Lumbar paraspinals    Quadratus Lumborum    Gluteus Maximus    Gluteus Medius    Deep hip external rotators    PSIS    Fortin's Area (SIJ)  Greater Trochanter    ASIS    Sacral border    Coccyx    Ischial tuberosity    (blank rows = not tested) Graded on 0-4 scale (0 = no pain, 1 = pain, 2 = pain with wincing/grimacing/flinching, 3 = pain with withdrawal, 4 = unwilling to allow palpation)   PHYSICAL PERFORMANCE MEASURES:  STS: WFL Deep Squat: RLE STS: LLE STS:  6MWT: 5TSTS:     EXTERNAL PELVIC EXAM: deferred 2/2 to time constraints None given; testing deferred to later date  Breath coordination: Voluntary Contraction: present/absent Relaxation: full/delayed/non-relaxing Perineal movement with sustained IAP increase ("bear down"): descent/no change/elevation/excessive descent Perineal movement with rapid IAP increase ("cough"): elevation/no change/descent Palpation of bulbocavernosus: Palpation of ischiocavernosus: Palpation of pubic symphysis: Palpation of superficial transverse perineal:     PATIENT EDUCATION:  Patient educated on prognosis, POC, and provided with HEP including: gentle spinal mobility, pregnancy handout. Patient articulated understanding and returned demonstration. Patient will benefit from further education in order to maximize compliance and understanding for long-term therapeutic gains.   PATIENT SURVEYS:  FOTO PFDI Pain 100  ASSESSMENT:  Clinical Impression: Patient is a 31 year old presenting to clinic with chief complaints of L sided lumbosacral pain and PSJ pain. Today's evaluation is suggestive of deficits in posture, spinal mobility, IAP management, and pain as evidenced by 10/10 worst pain, presence of visible coning with lumbar extension, lumbar flexion limited to mid-thigh reach, and excessive use of bony congruency for postural stability. Patient's responses on PFDI Pain outcome  measures (100) indicate severe functional limitations/disability/distress. Patient has not yet responded to conservative management and "no show"ed for the first two evaluations scheduled with no follow-up communication until today's reschedule; these factors indicate a lower likelihood of productive participation in physical therapy; however, patient was very motivated and participatory today. Patient will benefit from continued skilled therapeutic intervention to address deficits in posture, spinal mobility, IAP management, and pain in order to increase function and improve overall QOL.   Objective impairments: Abnormal gait, decreased activity tolerance, decreased coordination, decreased endurance, decreased mobility, difficulty walking, decreased ROM, decreased strength, improper body mechanics, postural dysfunction, and pain.   Activity limitations: meal prep, cleaning, laundry, interpersonal relationship, shopping, community activity, and yard work.   Personal factors: Behavior pattern, Past/current experiences, Time since onset of injury/illness/exacerbation, and 3+ comorbidities: hx of placenta previa, hyperemesis gravidarum, umbilical hernia  are also affecting patient's functional outcome.   Rehab Potential: Fair    Clinical decision making: Evolving/moderate complexity  Evaluation complexity: Moderate   GOALS: Goals reviewed with patient? Yes  SHORT TERM GOALS: Target date: 02/07/2022  Patient will demonstrate independence with HEP in order to maximize therapeutic gains and improve carryover from physical therapy sessions to ADLs in the home and community. Baseline: provided Goal status: INITIAL  LONG TERM GOALS: Target date: 04/04/2022  Patient will demonstrate improved function as evidenced by a score of 50 or less on PFDI Pain measure for full participation in activities at home and in the community.  Baseline: 100 Goal status: INITIAL   Patient will be able to  articulate and demonstrate 3-5 postural strengthening interventions for improved independent management of recurrent back pain. Baseline: not demonstrated Goal status: INITIAL  Patient will decrease worst pain as reported on NPRS by at least 2 points to demonstrate clinically significant reduction in pain in order to restore/improve function and overall QOL. Baseline: 10/10 Goal status: INITIAL   PLAN: Rehab frequency: 1x/week  Rehab duration: 12 weeks  Planned interventions:  Therapeutic exercises, Therapeutic activity, Neuromuscular re-education, Balance training, Gait training, Patient/Family education, Self Care, Joint mobilization, Orthotic/Fit training, Spinal mobilization, Cryotherapy, Moist heat, Taping, and Manual therapy     Myles Gip PT, DPT [phone]  01/10/2022, 1:45 PM

## 2022-01-12 ENCOUNTER — Encounter: Payer: Medicaid Other | Admitting: Physical Therapy

## 2022-01-18 ENCOUNTER — Ambulatory Visit: Payer: Medicaid Other | Admitting: Physical Therapy

## 2022-01-20 ENCOUNTER — Encounter: Payer: Medicaid Other | Admitting: Physical Therapy

## 2022-04-18 NOTE — L&D Delivery Note (Signed)
Delivery Note  Jenna Mcgrath is a [id number] at 80w2dwith an LMP of 08/23/21, consistent with [place]at 821w3d  First Stage: Labor onset: 0100  Analgesia /Anesthesia intrapartum: IVPM AROM at 0732 GBS: negative IP Antibiotics: none  Second Stage: Complete dilation at 0832 Onset of pushing at 0832 FHR second stage 125 bpm with moderate, milld variable decels with pushing   Jenna Mcgrath presented to L&D with uterine contractions. She was 5/80/-2. She progressed  to C/C/+2 with a spontaneous urge to push.  She pushed  effectively over approximately 3 minutes for a spontaneous vaginal birth.  Delivery of a viable baby girl on 06/01/22 at 0835 by CNM Delivery of fetal head in OA position with restitution to LOT. loose nuchal cord reduced;  Anterior then posterior shoulders delivered easily with gentle downward traction. Baby placed on mom's chest, and attended to by baby RN Cord double clamped after cessation of pulsation, cut by FOB  Cord blood sample collection: Not Indicated A POS [facility]formed [facility] [id number] of cord blood donation no Arterial cord blood sample  no  Third Stage: Oxytocin bolus started after delivery of infant for hemorrhage prophylaxis  Placenta delivered schultz intact with 3 VC @ 0843 Placenta disposition: discarded per protocol Uterine tone firm / bleeding scant  no laceration identified  Anesthesia for repair: N/A Repair N/A Est. Blood Loss (mL): 50  Complications: none  Mom to postpartum.  Baby to Couplet care / Skin to Skin.  Newborn: Information for the patient's newborn:  Jenna Mcgrath, [id number] born female "Jenna Mcgrath" Birth Weight:   APGAR: 8,759  Newborn Delivery   Birth date/time: 06/01/2022 08:35:00 Delivery type: Vaginal, Spontaneous       Feeding planned: breast feeding  ---------- FeAvelino LeedsCNM Certified Nurse Midwife [facility]

## 2022-05-18 LAB — OB RESULTS CONSOLE GBS: GBS: NEGATIVE

## 2022-05-20 ENCOUNTER — Encounter: Payer: Self-pay | Admitting: Obstetrics and Gynecology

## 2022-05-20 ENCOUNTER — Observation Stay
Admission: EM | Admit: 2022-05-20 | Discharge: 2022-05-20 | Disposition: A | Discharge status: Home / Self Care | Payer: Medicaid Other | Attending: Obstetrics and Gynecology | Admitting: Obstetrics and Gynecology

## 2022-05-20 ENCOUNTER — Other Ambulatory Visit: Payer: Self-pay

## 2022-05-20 DIAGNOSIS — O99333 Smoking (tobacco) complicating pregnancy, third trimester: Secondary | ICD-10-CM | POA: Insufficient documentation

## 2022-05-20 DIAGNOSIS — F17290 Nicotine dependence, other tobacco product, uncomplicated: Secondary | ICD-10-CM | POA: Diagnosis not present

## 2022-05-20 DIAGNOSIS — Z79899 Other long term (current) drug therapy: Secondary | ICD-10-CM | POA: Diagnosis not present

## 2022-05-20 DIAGNOSIS — O0993 Supervision of high risk pregnancy, unspecified, third trimester: Secondary | ICD-10-CM | POA: Diagnosis not present

## 2022-05-20 DIAGNOSIS — Z3A38 38 weeks gestation of pregnancy: Secondary | ICD-10-CM | POA: Insufficient documentation

## 2022-05-20 DIAGNOSIS — Z0371 Encounter for suspected problem with amniotic cavity and membrane ruled out: Secondary | ICD-10-CM | POA: Diagnosis present

## 2022-05-20 DIAGNOSIS — O471 False labor at or after 37 completed weeks of gestation: Secondary | ICD-10-CM | POA: Diagnosis present

## 2022-05-20 LAB — WET PREP, GENITAL
Clue Cells Wet Prep HPF POC: NONE SEEN
Sperm: NONE SEEN
Trich, Wet Prep: NONE SEEN
WBC, Wet Prep HPF POC: >=10 — AB (ref <10)
Yeast Wet Prep HPF POC: NONE SEEN

## 2022-05-20 LAB — RUPTURE OF MEMBRANE (ROM)PLUS: Rom Plus: NEGATIVE

## 2022-05-20 NOTE — OB Triage Note (Signed)
Pt arrived by wheelchair with ED staff with complaints of contractions and possible ROM as she wore a towel and tan colored. Patient placed on EFM and TOCO to non tender area of abdomen. Pt reports some nausea. Patient notified of visitation policy. Medical history reviewed.  Sheppard Penton, CNM notified of patient arrival and is on unit at nurses station.

## 2022-05-20 NOTE — Discharge Summary (Signed)
Jenna Mcgrath is a 32 y.o. female. She is at [redacted]w[redacted]d gestation. Patient's last menstrual period was 08/23/2021 (approximate). Estimated Date of Delivery: 05/30/22  Prenatal care site: [facility]   Current pregnancy complicated by:  Umbilical hernia Hx resolved previa with other 2 pregnancies Hyperemesis, managed with zofran and phenergan Elevated A1C 6.1; 1hr 171, normal 3hr GTT [phone]   Chief complaint: irreg UCs, c/o possible LOF.    S: Resting comfortably. no CTX, no VB.no LOF,  Active fetal movement. Denies: HA, visual changes, SOB, or RUQ/epigastric pain  Maternal Medical History:   Past Medical History:  Diagnosis Date   Medical history non-contributory     Past Surgical History:  Procedure Laterality Date   TONSILLECTOMY AND ADENOIDECTOMY     VULVA SURGERY      Allergies  Allergen Reactions   Mixed Ragweed     Prior to Admission medications   Medication Sig Start Date End Date Taking? Authorizing Provider  erythromycin ophthalmic ointment Place a 1/2 inch ribbon of ointment into the lower eyelid of R eye tid x 7 days 08/23/21   Rodriguez-Southworth, Sunday Spillers, PA-C  omeprazole (PRILOSEC) 40 MG capsule Take 1 capsule (40 mg total) by mouth daily. 09/09/21 11/08/21  Blake Divine, MD  sucralfate (CARAFATE) 1 GM/10ML suspension Take 10 mLs (1 g total) by mouth 4 (four) times daily for 3 days. 11/05/21 11/08/21  Lucrezia Starch, MD      Social History: She  reports that she has been smoking cigars. She has never used smokeless tobacco. She reports that she does not currently use alcohol. She reports that she does not use drugs.  Family History: family history includes Cancer in her maternal grandmother; Colon cancer in her paternal grandfather; Diabetes in her father, maternal grandmother, mother, and paternal grandmother; Hypertension in her father.   Review of Systems: A full review of systems was performed and negative except as noted in the HPI.      O:  BP 136/80   Pulse 95   Temp 98.2 F (36.8 C) (Oral)   Resp 14   LMP 08/23/2021 (Approximate)  Results for orders placed or performed during the hospital encounter of 05/20/22 (from the past 48 hour(s))  Wet prep, genital   Collection Time: 05/20/22  9:18 PM   Specimen: Vaginal  Result Value Ref Range   Yeast Wet Prep HPF POC NONE SEEN NONE SEEN   Trich, Wet Prep NONE SEEN NONE SEEN   Clue Cells Wet Prep HPF POC NONE SEEN NONE SEEN   WBC, Wet Prep HPF POC >=10 (A) <10   Sperm NONE SEEN   ROM Plus ([facility] only)   Collection Time: 05/20/22  9:18 PM  Result Value Ref Range   Rom Plus NEGATIVE      Constitutional: NAD, AAOx3  HE/ENT: extraocular movements grossly intact, moist mucous membranes CV: RRR PULM: nl respiratory effort, CTABL     Abd: gravid, non-tender, non-distended, soft      Ext: Non-tender, Nonedematous   Psych: mood appropriate, speech normal Pelvic: 1/50/-3, soft/posterior  Fetal  monitoring: Cat I Appropriate for GA Baseline: 140bpm Variability: moderate Accelerations:  present x >2 Decelerations absent  TOCO: q5-69min, mild    A/P: 32 y.o. [redacted]w[redacted]d here for antenatal surveillance for contractions and suspected ROM not found  Principle Diagnosis:  High risk pregnancy in third trimester  Labor: not present.  Fetal Wellbeing: Reassuring Cat 1 tracing; Reactive NST  Neg wet prep and neg ROM Plus D/c home stable, precautions  reviewed, follow-up as scheduled.    Francetta Found, CNM 05/20/2022  9:54 PM

## 2022-06-01 ENCOUNTER — Inpatient Hospital Stay
Admission: EM | Admit: 2022-06-01 | Discharge: 2022-06-02 | DRG: 805 | Disposition: A | Discharge status: Home / Self Care | Payer: Medicaid Other | Attending: Obstetrics | Admitting: Obstetrics

## 2022-06-01 ENCOUNTER — Other Ambulatory Visit: Payer: Self-pay

## 2022-06-01 ENCOUNTER — Encounter: Payer: Self-pay | Admitting: Obstetrics and Gynecology

## 2022-06-01 DIAGNOSIS — U071 COVID-19: Secondary | ICD-10-CM | POA: Diagnosis present

## 2022-06-01 DIAGNOSIS — O9852 Other viral diseases complicating childbirth: Secondary | ICD-10-CM | POA: Diagnosis present

## 2022-06-01 DIAGNOSIS — F17290 Nicotine dependence, other tobacco product, uncomplicated: Secondary | ICD-10-CM | POA: Diagnosis present

## 2022-06-01 DIAGNOSIS — O480 Post-term pregnancy: Secondary | ICD-10-CM | POA: Diagnosis present

## 2022-06-01 DIAGNOSIS — O99334 Smoking (tobacco) complicating childbirth: Secondary | ICD-10-CM | POA: Diagnosis present

## 2022-06-01 DIAGNOSIS — Z3A40 40 weeks gestation of pregnancy: Secondary | ICD-10-CM

## 2022-06-01 LAB — TYPE AND SCREEN
ABO/RH(D): A POS
Antibody Screen: NEGATIVE

## 2022-06-01 LAB — CBC
HCT: 35.7 % — ABNORMAL LOW (ref 36.0–46.0)
Hemoglobin: 11.2 g/dL — ABNORMAL LOW (ref 12.0–15.0)
MCH: 26.3 pg (ref 26.0–34.0)
MCHC: 31.4 g/dL (ref 30.0–36.0)
MCV: 83.8 fL (ref 80.0–100.0)
Platelets: 313 10*3/uL (ref 150–400)
RBC: 4.26 MIL/uL (ref 3.87–5.11)
RDW: 15.9 % — ABNORMAL HIGH (ref 11.5–15.5)
WBC: 9.1 10*3/uL (ref 4.0–10.5)
nRBC: 0.2 % (ref 0.0–0.2)

## 2022-06-01 LAB — RPR: RPR Ser Ql: NONREACTIVE

## 2022-06-01 LAB — ABO/RH: ABO/RH(D): A POS

## 2022-06-01 MED ORDER — LIDOCAINE HCL (PF) 1 % IJ SOLN
INTRAMUSCULAR | Status: AC
  Filled 2022-06-01: qty 30

## 2022-06-01 MED ORDER — FLEET ENEMA 7-19 GM/118ML RE ENEM: 1.0000 | ENEMA | Freq: Every day | RECTAL | Status: DC | PRN

## 2022-06-01 MED ORDER — COCONUT OIL OIL: 1.0000 | TOPICAL_OIL | Status: DC | PRN

## 2022-06-01 MED ORDER — ONDANSETRON HCL 4 MG/2ML IJ SOLN: 4.0000 mg | Freq: Four times a day (QID) | INTRAMUSCULAR | Status: DC | PRN

## 2022-06-01 MED ORDER — OXYTOCIN 10 UNIT/ML IJ SOLN
INTRAMUSCULAR | Status: AC
  Filled 2022-06-01: qty 2

## 2022-06-01 MED ORDER — MISOPROSTOL 200 MCG PO TABS
ORAL_TABLET | ORAL | Status: AC
  Filled 2022-06-01: qty 4

## 2022-06-01 MED ORDER — ZOLPIDEM TARTRATE 5 MG PO TABS: 5.0000 mg | ORAL_TABLET | Freq: Every evening | ORAL | Status: DC | PRN

## 2022-06-01 MED ORDER — OXYTOCIN-SODIUM CHLORIDE 30-0.9 UT/500ML-% IV SOLN
2.5000 [IU]/h | INTRAVENOUS | Status: DC
  Administered 2022-06-01: 2.5 [IU]/h via INTRAVENOUS
  Filled 2022-06-01: qty 500

## 2022-06-01 MED ORDER — WITCH HAZEL-GLYCERIN EX PADS: 1.0000 | MEDICATED_PAD | CUTANEOUS | Status: DC | PRN

## 2022-06-01 MED ORDER — SODIUM CHLORIDE 0.9 % IV SOLN: 250.0000 mL | INTRAVENOUS | Status: DC | PRN

## 2022-06-01 MED ORDER — SIMETHICONE 80 MG PO CHEW: 80.0000 mg | CHEWABLE_TABLET | ORAL | Status: DC | PRN

## 2022-06-01 MED ORDER — DIPHENHYDRAMINE HCL 25 MG PO CAPS: 25.0000 mg | ORAL_CAPSULE | Freq: Four times a day (QID) | ORAL | Status: DC | PRN

## 2022-06-01 MED ORDER — ACETAMINOPHEN 325 MG PO TABS
650.0000 mg | ORAL_TABLET | ORAL | Status: DC | PRN
  Administered 2022-06-01 – 2022-06-02 (×5): 650 mg via ORAL
  Filled 2022-06-01 (×5): qty 2

## 2022-06-01 MED ORDER — BENZOCAINE-MENTHOL 20-0.5 % EX AERO
1.0000 | INHALATION_SPRAY | CUTANEOUS | Status: DC | PRN
  Administered 2022-06-01 – 2022-06-02 (×2): 1 via TOPICAL
  Filled 2022-06-01 (×2): qty 56

## 2022-06-01 MED ORDER — SALINE SPRAY 0.65 % NA SOLN
1.0000 | NASAL | Status: DC | PRN
  Administered 2022-06-02: 1 via NASAL
  Filled 2022-06-01 (×2): qty 44

## 2022-06-01 MED ORDER — ONDANSETRON HCL 4 MG PO TABS: 4.0000 mg | ORAL_TABLET | ORAL | Status: DC | PRN

## 2022-06-01 MED ORDER — ONDANSETRON HCL 4 MG/2ML IJ SOLN: 4.0000 mg | INTRAMUSCULAR | Status: DC | PRN

## 2022-06-01 MED ORDER — SODIUM CHLORIDE 0.9% FLUSH: 3.0000 mL | Freq: Two times a day (BID) | INTRAVENOUS | Status: DC

## 2022-06-01 MED ORDER — BISACODYL 10 MG RE SUPP: 10.0000 mg | Freq: Every day | RECTAL | Status: DC | PRN

## 2022-06-01 MED ORDER — LACTATED RINGERS IV SOLN: INTRAVENOUS | Status: DC

## 2022-06-01 MED ORDER — OXYCODONE HCL 5 MG PO TABS
5.0000 mg | ORAL_TABLET | ORAL | Status: DC | PRN
  Administered 2022-06-01 – 2022-06-02 (×4): 5 mg via ORAL
  Filled 2022-06-01 (×5): qty 1

## 2022-06-01 MED ORDER — IBUPROFEN 600 MG PO TABS
600.0000 mg | ORAL_TABLET | Freq: Four times a day (QID) | ORAL | Status: DC
  Administered 2022-06-01 – 2022-06-02 (×4): 600 mg via ORAL
  Filled 2022-06-01 (×4): qty 1

## 2022-06-01 MED ORDER — SENNOSIDES-DOCUSATE SODIUM 8.6-50 MG PO TABS
2.0000 | ORAL_TABLET | ORAL | Status: DC
  Administered 2022-06-01 – 2022-06-02 (×2): 2 via ORAL
  Filled 2022-06-01 (×3): qty 2

## 2022-06-01 MED ORDER — FENTANYL CITRATE (PF) 100 MCG/2ML IJ SOLN
50.0000 ug | INTRAMUSCULAR | Status: DC | PRN
  Administered 2022-06-01 (×2): 100 ug via INTRAVENOUS
  Filled 2022-06-01 (×2): qty 2

## 2022-06-01 MED ORDER — PRENATAL MULTIVITAMIN CH
1.0000 | ORAL_TABLET | Freq: Every day | ORAL | Status: DC
  Administered 2022-06-01 – 2022-06-02 (×2): 1 via ORAL
  Filled 2022-06-01 (×3): qty 1

## 2022-06-01 MED ORDER — AMMONIA AROMATIC IN INHA
RESPIRATORY_TRACT | Status: AC
  Filled 2022-06-01: qty 10

## 2022-06-01 MED ORDER — IBUPROFEN 600 MG PO TABS
ORAL_TABLET | ORAL | Status: AC
  Administered 2022-06-01: 600 mg via ORAL
  Filled 2022-06-01: qty 1

## 2022-06-01 MED ORDER — DIBUCAINE (PERIANAL) 1 % EX OINT: 1.0000 | TOPICAL_OINTMENT | CUTANEOUS | Status: DC | PRN

## 2022-06-01 MED ORDER — LIDOCAINE HCL (PF) 1 % IJ SOLN: 30.0000 mL | INTRAMUSCULAR | Status: DC | PRN

## 2022-06-01 MED ORDER — ACETAMINOPHEN 325 MG PO TABS: 650.0000 mg | ORAL_TABLET | ORAL | Status: DC | PRN

## 2022-06-01 MED ORDER — SODIUM CHLORIDE 0.9% FLUSH: 3.0000 mL | INTRAVENOUS | Status: DC | PRN

## 2022-06-01 MED ORDER — OXYTOCIN BOLUS FROM INFUSION
333.0000 mL | Freq: Once | INTRAVENOUS | Status: AC
  Administered 2022-06-01: 333 mL via INTRAVENOUS

## 2022-06-01 MED ORDER — SOD CITRATE-CITRIC ACID 500-334 MG/5ML PO SOLN: 30.0000 mL | ORAL | Status: DC | PRN

## 2022-06-01 MED ORDER — LACTATED RINGERS IV SOLN: 500.0000 mL | INTRAVENOUS | Status: DC | PRN

## 2022-06-01 NOTE — Discharge Summary (Signed)
Obstetrical Discharge Summary  Patient Name: Jenna Mcgrath DOB: 1990-06-09 MRN: [id number]  Date of Admission: 06/01/2022 Date of Delivery: 06/01/21 Delivered by: Jenna Mcgrath, CNM  Date of Discharge: 06/02/2022  Primary OB: [facility] [id number] last menstrual period was 08/23/2021 (approximate). EDC Estimated Date of Delivery: 05/30/22 Gestational Age at Delivery: [redacted]w[redacted]d  Antepartum complications:  Hx of placenta previa with prior pregnancies both resolved before delivery Umbilical hernia Hyperemesis gravidarum  Admitting Diagnosis: Normal labor and delivery [O80]  Secondary Diagnosis: Patient Active Problem List   Diagnosis Date Noted   Normal labor and delivery 06/01/2022   Encounter for suspected PROM, with rupture of membranes not found 05/20/2022   Encounter for supervision of normal pregnancy in second trimester 11/16/2021   Encounter for initial prescription of Nexplanon 06/20/2017   Vitamin D deficiency 12/05/2016    Discharge Diagnosis: Term Pregnancy Delivered      Augmentation: AROM Complications: None Intrapartum complications/course: KPagetpresented to L&D with uterine contractions. She was 5/80/-2. She progressed  to C/C/+2 with a spontaneous urge to push.  She pushed  effectively over approximately 3 minutes for a spontaneous vaginal birth.  Delivery Type: spontaneous vaginal delivery Anesthesia: IV narcotics Placenta: spontaneous To Pathology: No  Laceration: n/a Episiotomy: none Newborn Data: Live born female  Birth Weight:  7lb 4.1oz APGAR: 881 9  Newborn Delivery   Birth date/time: 06/01/2022 08:35:00 Delivery type: Vaginal, Spontaneous      Postpartum Procedures: none Edinburgh:     06/06/2017    3:00 PM  Edinburgh Postnatal Depression Scale Screening Tool  I have been able to laugh and see the funny side of things. 0  I have looked forward with enjoyment to things. 0  I have blamed myself unnecessarily when things  went wrong. 0  I have been anxious or worried for no good reason. 0  I have felt scared or panicky for no good reason. 0  Things have been getting on top of me. 0  I have been so unhappy that I have had difficulty sleeping. 0  I have felt sad or miserable. 0  I have been so unhappy that I have been crying. 0  The thought of harming myself has occurred to me. 0  Edinburgh Postnatal Depression Scale Total 0     Post partum course:   Patient had an uncomplicated postpartum course.  By time of discharge on PPD#1, her pain was controlled on oral pain medications; she had appropriate lochia and was ambulating, voiding without difficulty and tolerating regular diet.  She was deemed stable for discharge to home.     Discharge Physical Exam:   BP 130/88   Pulse 94   Temp 97.6 F (36.4 C) (Oral)   Resp 20   Ht 5' 7"$  (1.702 m)   Wt 114.8 kg   LMP 08/23/2021 (Approximate)   SpO2 98%   Breastfeeding Unknown   BMI 39.63 kg/m   General: NAD CV: RRR Pulm: CTABL, nl effort ABD: s/nd/nt, fundus firm and below the umbilicus Lochia: moderate Perineum: minimal edema/intact DVT Evaluation: LE non-ttp, no evidence of DVT on exam.  Hemoglobin  Date Value Ref Range Status  06/02/2022 11.0 (L) 12.0 - 15.0 g/dL Final  11/28/2016 12.7 11.1 - 15.9 g/dL Final   HCT  Date Value Ref Range Status  06/02/2022 35.4 (L) 36.0 - 46.0 % Final   Hematocrit  Date Value Ref Range Status  11/28/2016 38.9 34.0 - 46.6 % Final    Risk assessment for  postpartum VTE and prophylactic treatment: Very high risk factors: None High risk factors: None Moderate risk factors: BMI 30-40 kg/m2  Postpartum VTE prophylaxis with LMWH not indicated  Disposition: stable, discharge to home. Baby Feeding: breast feeding Baby Disposition: home with mom  Rh Immune globulin indicated: No Rubella vaccine given: was not indicated Varivax vaccine given: was not indicated Flu vaccine given in AP setting: declined Tdap  vaccine given in AP setting: declined  Contraception: IUD Mirena  Prenatal Labs:   Blood type/Rh A pos  Antibody screen Negative    Rubella Immune (08/13 0000)   Varicella Immune  RPR Nonreactive (08/13 0000)   HBsAg Negative (08/13 0000)  Hep C NR   HIV Non-reactive (08/13 0000)   GC neg  Chlamydia neg  Genetic screening cfDNA negative   1 hour GTT 171  3 hour GTT 88, 158, 147, 68  GBS Negative/-- (01/31 0000)     Plan:  [name] was discharged to home in good condition. Follow-up appointment with delivering provider in 6 weeks.  Discharge Medications: Allergies as of 06/02/2022       Reactions   Mixed Ragweed         Medication List     STOP taking these medications    erythromycin ophthalmic ointment   sucralfate 1 GM/10ML suspension Commonly known as: Carafate       TAKE these medications    acetaminophen 325 MG tablet Commonly known as: Tylenol Take 2 tablets (650 mg total) by mouth every 4 (four) hours as needed (for pain scale < 4).   benzocaine-Menthol 20-0.5 % Aero Commonly known as: DERMOPLAST Apply 1 Application topically as needed for irritation (perineal discomfort).   ibuprofen 600 MG tablet Commonly known as: ADVIL Take 1 tablet (600 mg total) by mouth every 6 (six) hours.   omeprazole 40 MG capsule Commonly known as: PRILOSEC Take 1 capsule (40 mg total) by mouth daily.   witch hazel-glycerin pad Commonly known as: TUCKS Apply 1 Application topically as needed for hemorrhoids (for pain).         Follow-up Information     Jenna Mcgrath Arlyn Leak, CNM Follow up in 6 week(s).   Specialty: Obstetrics Why: 6wk postpartum, Mirena IUD placement Contact information: [address] [place] [address] [phone]                 Signed: Gertie Fey, [place] 06/02/2022

## 2022-06-01 NOTE — H&P (Signed)
OB History & Physical   History of Present Illness:   Chief Complaint: uterine contractions  HPI:  Jenna Mcgrath is a 32 y.o. [phone] female at [redacted]w[redacted]d Patient's last menstrual period was 08/23/2021 (approximate)., consistent with [place] 82w3dwith Estimated Date of Delivery: 05/30/22.  She presents to L&D for normal labor and delivery  Reports active fetal movement  Contractions: every 1 to 3 minutes LOF/SROM: AROM @ 0732 Vaginal bleeding: none  Factors complicating pregnancy:  Hx of placenta previa with prior pregnancies both resolved before delivery Umbilical hernia Hyperemesis gravidarum  Patient Active Problem List   Diagnosis Date Noted   Normal labor and delivery 06/01/2022   Encounter for suspected PROM, with rupture of membranes not found 05/20/2022   Encounter for initial prescription of Nexplanon 06/20/2017   Vitamin D deficiency 12/05/2016    Prenatal Transfer Tool  Maternal Diabetes: No Genetic Screening: Normal Maternal Ultrasounds/Referrals: Normal Fetal Ultrasounds or other Referrals:  None Maternal Substance Abuse:  No Significant Maternal Medications:  None Significant Maternal Lab Results: Group B Strep negative  Maternal Medical History:   Past Medical History:  Diagnosis Date   Medical history non-contributory     Past Surgical History:  Procedure Laterality Date   TONSILLECTOMY AND ADENOIDECTOMY     VULVA SURGERY      Allergies  Allergen Reactions   Mixed Ragweed     Prior to Admission medications   Medication Sig Start Date End Date Taking? Authorizing Provider  erythromycin ophthalmic ointment Place a 1/2 inch ribbon of ointment into the lower eyelid of R eye tid x 7 days 08/23/21   Rodriguez-Southworth, SySunday SpillersPA-C  omeprazole (PRILOSEC) 40 MG capsule Take 1 capsule (40 mg total) by mouth daily. 09/09/21 11/08/21  JeBlake DivineMD  sucralfate (CARAFATE) 1 GM/10ML suspension Take 10 mLs (1 g total) by mouth 4 (four) times daily for  3 days. 11/05/21 11/08/21  SmLucrezia StarchMD     [facility]  OB History  Gravida Para Term Preterm AB Living  5 2 2 $ 0 2 2  SAB IAB Ectopic Multiple Live Births  1 1 0 0 2    # Outcome Date GA Lbr Len/2nd Weight Sex Delivery Anes PTL Lv  5 Current           4 Term 06/05/17 [id number]:44 / 00:02 3255 g F Vag-Spont None  LIV     Name: Jenna Mcgrath  Apgar1: 9  Apgar5: 9  3 SAB 03/19/15     SAB     2 Term 02/05/13 42w55w0dVag-Spont   LIV     Complications: Placenta Previa  1 IAB 05/19/09     TAB        Social History: She  reports that she has been smoking cigars. She has never used smokeless tobacco. She reports that she does not currently use alcohol. She reports that she does not use drugs.  Family History: family history includes Cancer in her maternal grandmother; Colon cancer in her paternal grandfather; Diabetes in her father, maternal grandmother, mother, and paternal grandmother; Hypertension in her father.   Review of Systems: A full review of systems was performed and negative except as noted in the HPI.     Physical Exam:  Vital Signs: BP 129/81 (BP Location: Left Arm)   Pulse (!) 102   Temp 98.1 F (36.7 C) (Oral)   Resp 18   Ht 5' 7"$  (  1.702 m)   Wt 114.8 kg   LMP 08/23/2021 (Approximate)   BMI 39.63 kg/m   General: no acute distress.  HEENT: normocephalic, atraumatic Heart: regular rate & rhythm Lungs: normal respiratory effort Abdomen: soft, gravid, non-tender;  EFW: 7 lbs Pelvic:   External: Normal external female genitalia  Cervix: Dilation: 7 / Effacement (%): 100 / Station: -2    Extremities: non-tender, symmetric, no edema bilaterally.  DTRs: +2  Neurologic: Alert & oriented x 3.    Results for orders placed or performed during the hospital encounter of 06/01/22 (from the past 24 hour(s))  Type and screen [place]     Status: None (Preliminary result)   Collection Time: 06/01/22   6:14 AM  Result Value Ref Range   ABO/RH(D) PENDING    Antibody Screen PENDING    Sample Expiration      06/04/2022,2359 Performed at [facility], [address] [place], [place] [address]   CBC     Status: Abnormal   Collection Time: 06/01/22  6:15 AM  Result Value Ref Range   WBC 9.1 4.0 - 10.5 K/uL   RBC 4.26 3.87 - 5.11 MIL/uL   Hemoglobin 11.2 (L) 12.0 - 15.0 g/dL   HCT 35.7 (L) 36.0 - 46.0 %   MCV 83.8 80.0 - 100.0 fL   MCH 26.3 26.0 - 34.0 pg   MCHC 31.4 30.0 - 36.0 g/dL   RDW 15.9 (H) 11.5 - 15.5 %   Platelets 313 150 - 400 K/uL   nRBC 0.2 0.0 - 0.2 %  ABO/Rh     Status: None (Preliminary result)   Collection Time: 06/01/22  6:21 AM  Result Value Ref Range   ABO/RH(D) PENDING     Pertinent Results:  Prenatal Labs: Blood type/Rh A pos  Antibody screen Negative    Rubella Immune (08/13 0000)   Varicella Immune  RPR Nonreactive (08/13 0000)   HBsAg Negative (08/13 0000)  Hep C NR   HIV Non-reactive (08/13 0000)   GC neg  Chlamydia neg  Genetic screening cfDNA negative   1 hour GTT 171  3 hour GTT 88, 158, 147, 68  GBS Negative/-- (01/31 0000)    FHT:  FHR: 140 bpm, variability: moderate,  accelerations:  Present,  decelerations:  Present intermittent mild variables Category/reactivity:  Category II UC:   regular, every 1-3 minutes   Cephalic by SVE   No results found.  Assessment:  Jenna Mcgrath is a 32 y.o. [phone] female at 91w2dwith normal labor.   Plan:  1. Admit to Labor & Delivery - consents reviewed and obtained - Dr. JGlennon Macnotified of admission and plan of care   2. Fetal Well being  - Fetal Tracing: Category 2 - Group B Streptococcus ppx not indicated: GBS negative - Presentation: Cephalic confirmed by SVE   3. Routine OB: - Prenatal labs reviewed, as above - Rh positive - CBC, T&S, RPR on admit - Clear liquid diet , continuous IV fluids  4. Monitoring of labor  - Contractions monitored with external toco -  Pelvis proven to 3255 g  - Plan for expectant management  - Augmentation with AROM as appropriate  - Plan for  continuous fetal monitoring - Maternal pain control as desired; planning IVPM - Anticipate vaginal delivery  5. Post Partum Planning: - Infant feeding: breast feeding - Contraception: IUD - Tdap vaccine:  declined - Flu vaccine:  declined  FLa Crescent CNM 06/01/22 7:45 AM  Avelino Leeds, CNM Certified Nurse Midwife [facility]

## 2022-06-02 LAB — RESP PANEL BY RT-PCR (RSV, FLU A&B, COVID)  RVPGX2
Influenza A by PCR: NEGATIVE
Influenza B by PCR: NEGATIVE
Resp Syncytial Virus by PCR: NEGATIVE
SARS Coronavirus 2 by RT PCR: POSITIVE — AB

## 2022-06-02 LAB — CBC
HCT: 35.4 % — ABNORMAL LOW (ref 36.0–46.0)
Hemoglobin: 11 g/dL — ABNORMAL LOW (ref 12.0–15.0)
MCH: 26.6 pg (ref 26.0–34.0)
MCHC: 31.1 g/dL (ref 30.0–36.0)
MCV: 85.7 fL (ref 80.0–100.0)
Platelets: 285 10*3/uL (ref 150–400)
RBC: 4.13 MIL/uL (ref 3.87–5.11)
RDW: 15.9 % — ABNORMAL HIGH (ref 11.5–15.5)
WBC: 8 10*3/uL (ref 4.0–10.5)
nRBC: 0 % (ref 0.0–0.2)

## 2022-06-02 MED ORDER — WITCH HAZEL-GLYCERIN EX PADS: 1.0000 | MEDICATED_PAD | CUTANEOUS | 2 refills | Status: DC | PRN

## 2022-06-02 MED ORDER — IBUPROFEN 600 MG PO TABS: 600.0000 mg | ORAL_TABLET | Freq: Four times a day (QID) | ORAL | 0 refills | Status: DC

## 2022-06-02 MED ORDER — BENZOCAINE-MENTHOL 20-0.5 % EX AERO: 1.0000 | INHALATION_SPRAY | CUTANEOUS | 1 refills | Status: DC | PRN

## 2022-06-02 MED ORDER — ACETAMINOPHEN 325 MG PO TABS: 650.0000 mg | ORAL_TABLET | ORAL | Status: DC | PRN

## 2022-06-02 NOTE — Discharge Instructions (Signed)

## 2023-03-02 ENCOUNTER — Ambulatory Visit: Payer: Self-pay

## 2023-04-07 LAB — OB RESULTS CONSOLE GC/CHLAMYDIA
Chlamydia: NEGATIVE
Neisseria Gonorrhea: NEGATIVE

## 2023-04-07 LAB — OB RESULTS CONSOLE HEPATITIS B SURFACE ANTIGEN: Hepatitis B Surface Ag: NEGATIVE

## 2023-04-07 LAB — OB RESULTS CONSOLE HIV ANTIBODY (ROUTINE TESTING): HIV: NONREACTIVE

## 2023-04-07 LAB — OB RESULTS CONSOLE RPR: RPR: NONREACTIVE

## 2023-04-07 LAB — OB RESULTS CONSOLE RUBELLA ANTIBODY, IGM: Rubella: IMMUNE

## 2023-04-07 LAB — OB RESULTS CONSOLE VARICELLA ZOSTER ANTIBODY, IGG: Varicella: IMMUNE

## 2023-04-19 NOTE — L&D Delivery Note (Addendum)
 Delivery Note  First Stage: Labor onset: 1600 Augmentation : AROM Analgesia /Anesthesia intrapartum: epidural AROM at 0240  Second Stage: Complete dilation at 0335 Onset of pushing at 0400 FHR second stage reassuring moderate variability  Delivery of a viable female infant 10/19/2023 at 0418 by Edsel Blush, CNM. delivery of fetal head in OA position with restitution to LOT. No nuchal cord; Left compound presentation, infant delivered her own arm after delivery of the head. Anterior then posterior shoulders delivered easily with gentle downward traction. Baby placed on mom's chest, and attended to by peds.  Cord double clamped after cessation of pulsation, cut by FOB  Third Stage: Placenta delivered Jenna Mcgrath intact with 3 VC @ 0425 Placenta disposition: discarded Uterine tone firm / bleeding scant  No laceration identified  Anesthesia for repair: n/a Repair none needed Est. Blood Loss (mL): 50ml  Complications: none  Mom to postpartum.  Baby to Couplet care / Skin to Skin.  Newborn: Birth Weight: 8lb 4oz Apgar Scores: 9, 9 Feeding planned: breastfeeding

## 2023-04-22 IMAGING — CT CT ABD-PELV W/ CM
2 of 4 series · 16 of 46 positions shown, 18 images · IV contrast (agent unspecified)
Comparison: 02/04/2020 and prior studies

CLINICAL DATA: 31-year-old female with acute RIGHT abdominal and
pelvic pain for 2 days.

EXAM:
CT ABDOMEN AND PELVIS WITH CONTRAST
TECHNIQUE: Multidetector CT imaging of the abdomen and pelvis was performed
using the standard protocol following bolus administration of
intravenous contrast.

[Series 2: abdomen 5.0 · axial · 0.66mm/px · z∈[-1097,-672]mm · 13 of 97 slices shown, 15 images]
[im 6/97  soft-tissue]
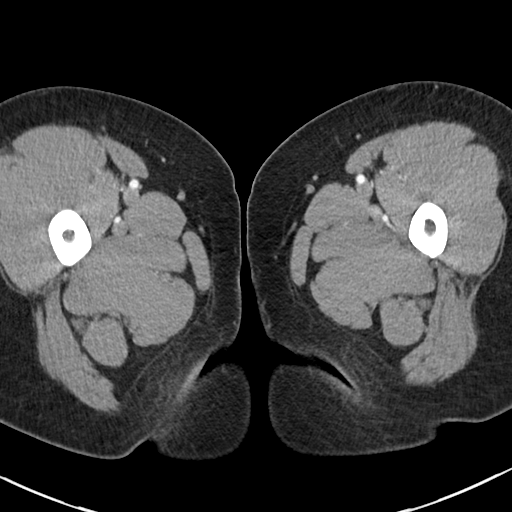
[im 6/97  bone]
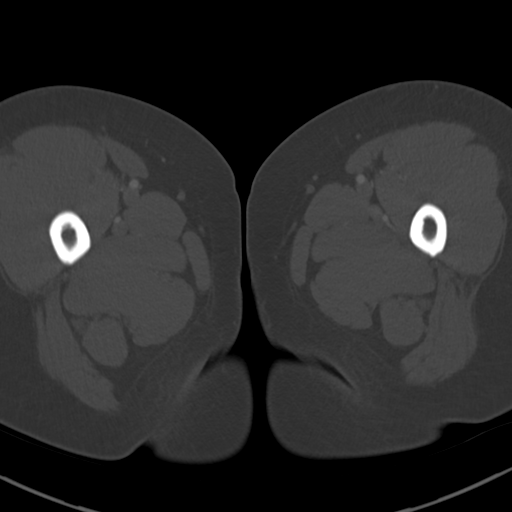
[im 11/97  soft-tissue]
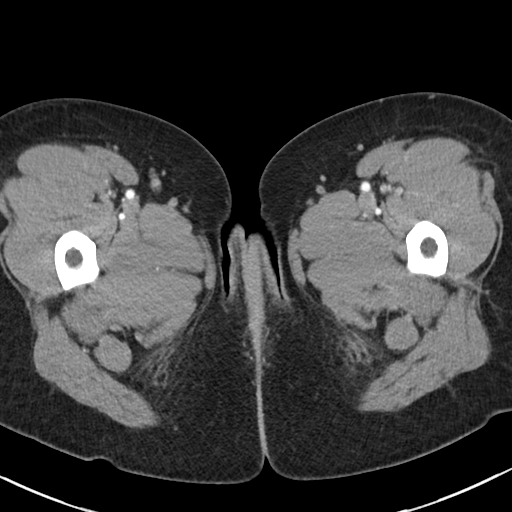
[im 22/97  soft-tissue]
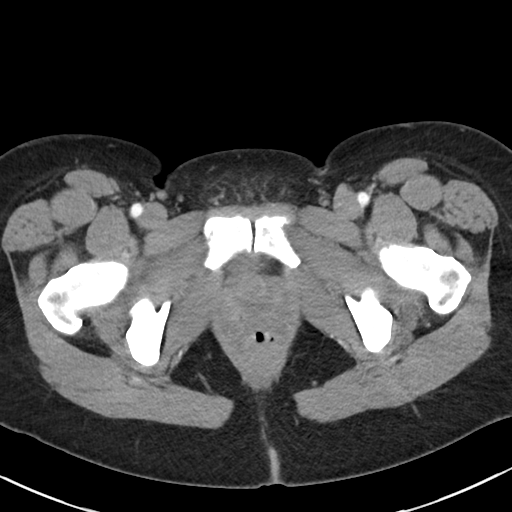
[im 27/97  soft-tissue]
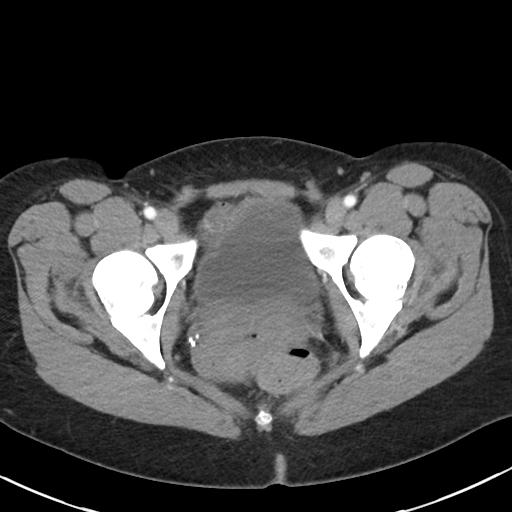
[im 33/97  soft-tissue]
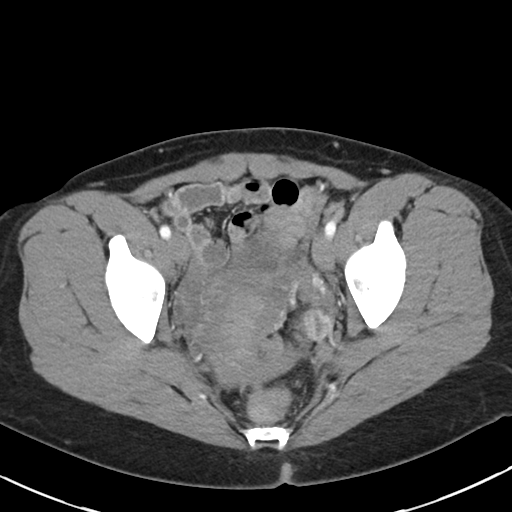
[im 43/97  soft-tissue]
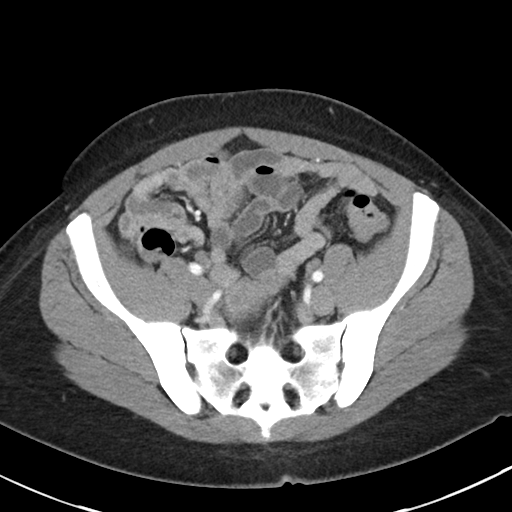
[im 49/97  soft-tissue]
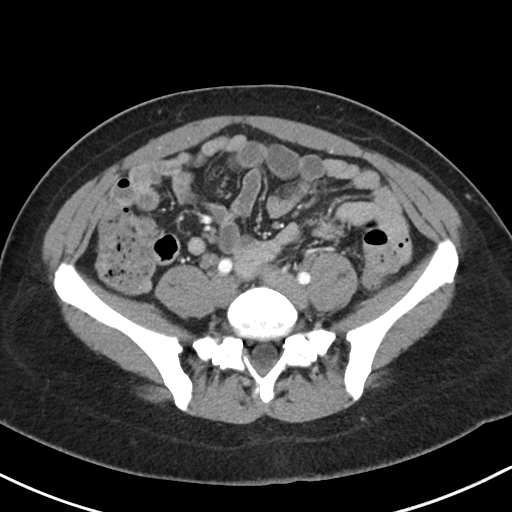
[im 54/97  soft-tissue]
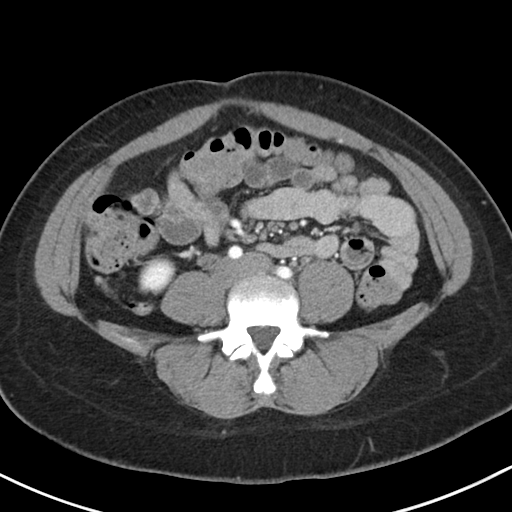
[im 65/97  soft-tissue]
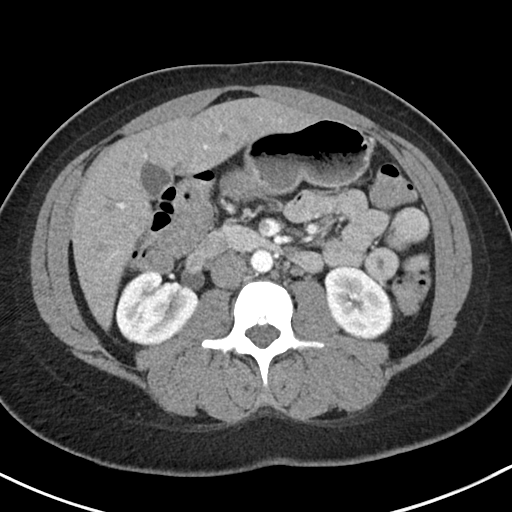
[im 65/97  bone]
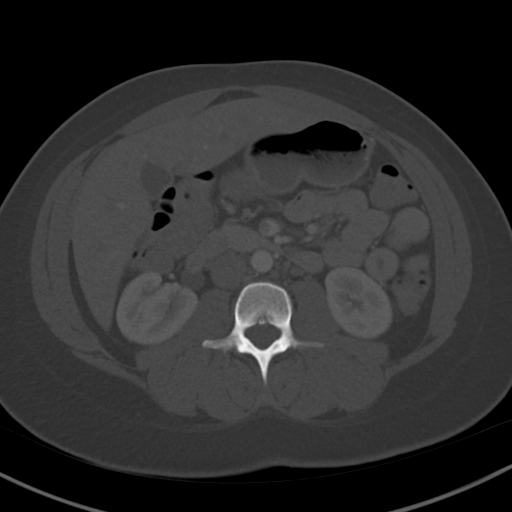
[im 70/97  soft-tissue]
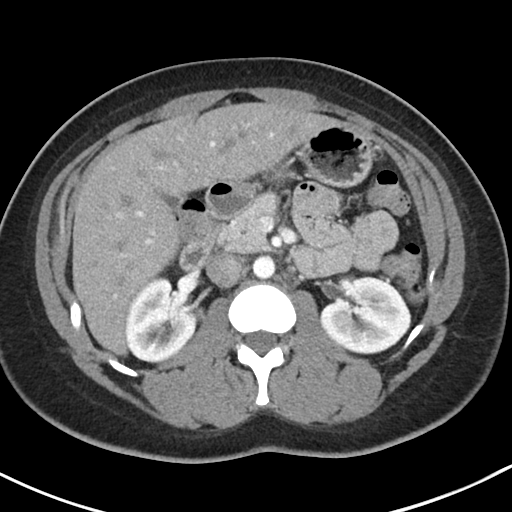
[im 75/97  soft-tissue]
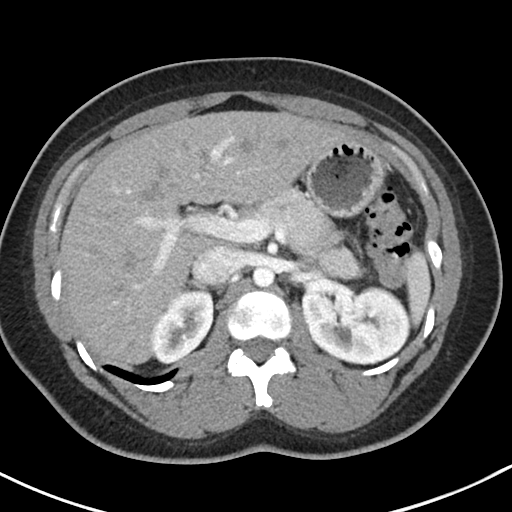
[im 86/97  soft-tissue]
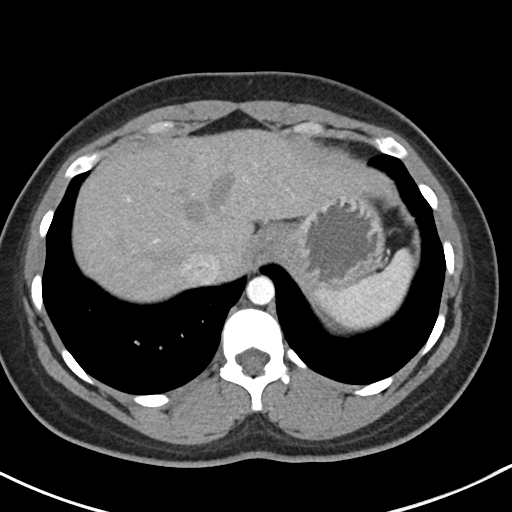
[im 91/97  soft-tissue]
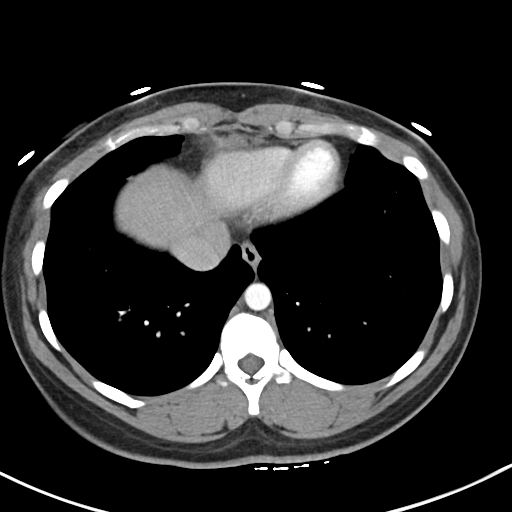

[Series 4: abdomen 3.0 mpr cor · coronal · 0.87mm/px · 3 of 97 slices shown]
[im 33/97  soft-tissue]
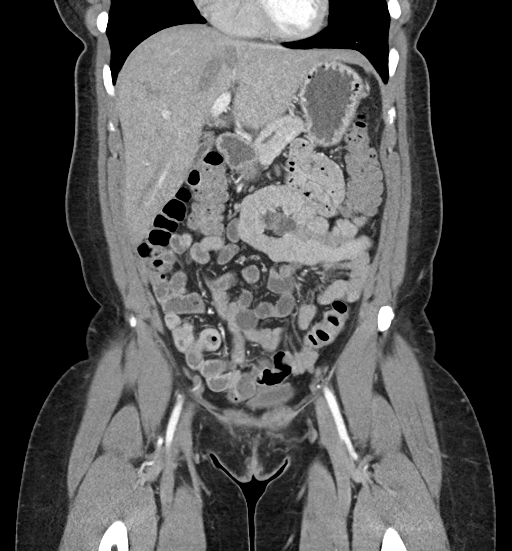
[im 43/97  soft-tissue]
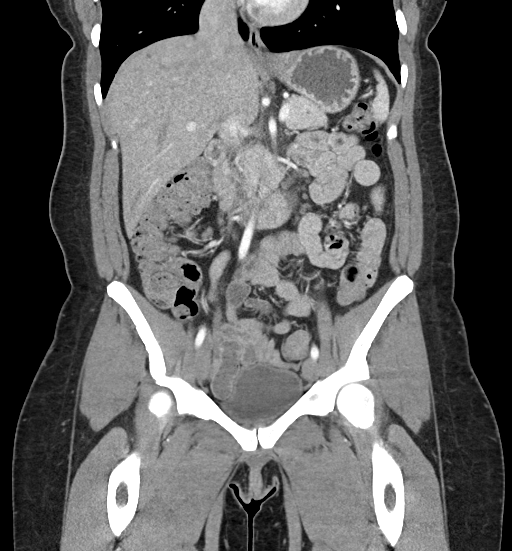
[im 54/97  soft-tissue]
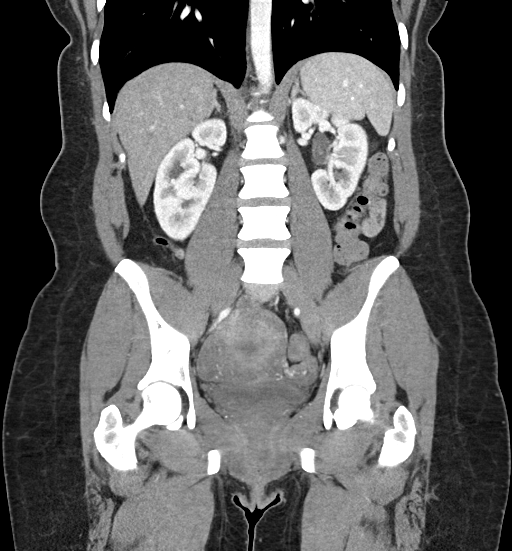

[16 of 46 positions shown; findings below may reference images not displayed]

RADIATION DOSE REDUCTION: This exam was performed according to the
departmental dose-optimization program which includes automated
exposure control, adjustment of the mA and/or kV according to
patient size and/or use of iterative reconstruction technique.

CONTRAST:  100mL OMNIPAQUE IOHEXOL 300 MG/ML  SOLN
FINDINGS: Lower chest: No acute abnormality.

Hepatobiliary: No significant liver or gallbladder abnormalities
identified. There is no evidence of intrahepatic or extrahepatic
biliary dilatation.

Pancreas: Unremarkable

Spleen: Unremarkable

Adrenals/Urinary Tract: A punctate nonobstructing RIGHT LOWER pole
renal calculus is identified. No other renal abnormalities are
noted. No evidence of hydronephrosis or obstructing urinary calculi
noted.

The adrenal glands and bladder are unremarkable.

Stomach/Bowel: Stomach is within normal limits. Appendix appears
normal. No evidence of bowel wall thickening, distention, or
inflammatory changes.

Vascular/Lymphatic: No significant vascular findings are present. No
enlarged abdominal or pelvic lymph nodes.

Reproductive: Uterus and bilateral adnexa are unremarkable.

Other: No ascites, focal collection or pneumoperitoneum.

Musculoskeletal: No acute or suspicious bony abnormalities are
noted.
IMPRESSION: 1. No evidence of acute abnormality.
2. Punctate nonobstructing RIGHT LOWER pole renal calculus.

## 2023-07-27 LAB — OB RESULTS CONSOLE RPR: RPR: REACTIVE

## 2023-08-07 ENCOUNTER — Ambulatory Visit: Admitting: Family Medicine

## 2023-08-07 DIAGNOSIS — G2581 Restless legs syndrome: Secondary | ICD-10-CM | POA: Insufficient documentation

## 2023-08-07 DIAGNOSIS — Z113 Encounter for screening for infections with a predominantly sexual mode of transmission: Secondary | ICD-10-CM

## 2023-08-07 DIAGNOSIS — A530 Latent syphilis, unspecified as early or late: Secondary | ICD-10-CM | POA: Insufficient documentation

## 2023-08-07 DIAGNOSIS — O98113 Syphilis complicating pregnancy, third trimester: Secondary | ICD-10-CM

## 2023-08-07 DIAGNOSIS — O10919 Unspecified pre-existing hypertension complicating pregnancy, unspecified trimester: Secondary | ICD-10-CM | POA: Insufficient documentation

## 2023-08-07 MED ORDER — PENICILLIN G BENZATHINE 2400000 UNIT/4ML IM SUSY
2.4000 10*6.[IU] | PREFILLED_SYRINGE | INTRAMUSCULAR | Status: AC
  Administered 2023-08-07: 2400000 [IU] via INTRAMUSCULAR

## 2023-08-07 NOTE — Assessment & Plan Note (Signed)
 No rashes or other stigmata of syphilis. No chart evidence of previous RPR test within the last year, however patient has MyChart result from initial prenatal 04/07/2023 with non-reactive RPR. Per state consultant Tiera, will need 3 shots bicillin  and re-collect of state RPR labs. Will discuss again with state consultant, because of nonreactive RPR in December, should need one bicillin  I believe.

## 2023-08-07 NOTE — Patient Instructions (Addendum)
 Positive syphilis test in pregnancy Because you had a treponemal specific positive test (T. Pallidum antibodies), positive non-treponemal test (RPR), and a quantitative amount of 1:1 detected, we are treating you like a positive syphilis case.  The state syphilis consultant wants us  to draw another RPR test so we have it on file with the state lab. We do not believe you are a false positive.  You will get treatment with an antibiotic called bicillin  starting today, then every week for a total of three weeks.  Please make an appointment to come back for bicillin  treatment on Monday 4/28 and Monday 5/05 Your husband will need an appointment to receive bicillin  and get tested, too.

## 2023-08-07 NOTE — Progress Notes (Signed)
 [facility] Department STI clinic [address] [place] [address] Main phone: [phone]  STI screening visit  Subjective:  Jenna Mcgrath is a 33 y.o. female being seen today for an STI screening visit. The patient reports they do not have symptoms. Currently pregnant at [redacted]w[redacted]d, EDD 10/19/23.  Patient's last menstrual period was 01/12/2023.  Patient has the following medical conditions:  Patient Active Problem List   Diagnosis Date Noted   HTN in pregnancy, chronic 08/07/2023   Restless legs 08/07/2023   Positive RPR test 08/07/2023   Encounter for suspected PROM, with rupture of membranes not found 05/20/2022   Vitamin D  deficiency 12/05/2016   Chief Complaint  Patient presents with   Exposure to STD    Pt is here for    HPI Patient reports her primary care doctor instructed her to follow up here for repeat RPR testing. Currently pregnant at [redacted]w[redacted]d, EDD 10/19/23.  Per our CD coordinator Warrick Habermann who looked into [facility] (congruent with Care Everywhere): On 4/15, 1:1 RPR, T palladium reactive   RPR testing on file, last done > 1 year ago on 06/01/2022 (when she delivered her last child). History below: 04/07/2023 non reactive    Also Care Everywhere reports negative RPR on 05/02/2022 and 11/17/2021.  Does the patient using douching products? No  Last HIV test per patient/review of record was No results found for: "HMHIVSCREEN"  Lab Results  Component Value Date   HIV Non-reactive 11/28/2021    Last HEPC test per patient/review of record was No results found for: "HMHEPCSCREEN" No components found for: "HEPC"   Last HEPB test per patient/review of record was No components found for: "HMHEPBSCREEN"   Patient reports last pap was:      Component Value Date/Time   DIAGPAP  11/28/2016 0000    NEGATIVE FOR INTRAEPITHELIAL LESIONS OR MALIGNANCY.   ADEQPAP  11/28/2016 0000    Satisfactory for evaluation   endocervical/transformation zone component PRESENT.   No results found for: "SPECADGYN" Result Date Procedure Results Follow-ups  11/28/2016 Cytology - PAP Adequacy: Satisfactory for evaluation  endocervical/transformation zone component PRESENT. Diagnosis: NEGATIVE FOR INTRAEPITHELIAL LESIONS OR MALIGNANCY. Material Submitted: CervicoVaginal Pap [ThinPrep Imaged] CYTOLOGY - PAP: PAP RESULT    Screening for MPX risk: Does the patient have an unexplained rash? No Is the patient MSM? No Does the patient endorse multiple sex partners or anonymous sex partners? No Did the patient have close or sexual contact with a person diagnosed with MPX? No Has the patient traveled outside the [place]  where MPX is endemic? No Is there a high clinical suspicion for MPX-- evidenced by one of the following No  -Unlikely to be chickenpox  -Lymphadenopathy  -Rash that present in same phase of evolution on any given body part See flowsheet for further details and programmatic requirements.   Immunization history:  Immunization History  Administered Date(s) Administered   Influenza Split 01/09/2014   PPD Test 11/28/2014   Tdap 05/29/2017    The following portions of the patient's history were reviewed and updated as appropriate: allergies, current medications, past medical history, past social history, past surgical history and problem list.  Objective:  There were no vitals filed for this visit.  Physical Exam Vitals and nursing note reviewed.  Constitutional:      General: She is not in acute distress.    Appearance: Normal appearance. She is not ill-appearing, toxic-appearing or diaphoretic.     Comments: Gravid uterus  HENT:  Head: Normocephalic.     Mouth/Throat:     Mouth: Mucous membranes are moist.  Eyes:     General: No scleral icterus.       Right eye: No discharge.        Left eye: No discharge.     Conjunctiva/sclera: Conjunctivae normal.  Pulmonary:     Effort: Pulmonary effort is  normal.  Genitourinary:    Comments: Declined genital exam- no symptoms, self swabbed Musculoskeletal:        General: Normal range of motion.     Cervical back: Neck supple. No rigidity or tenderness.  Lymphadenopathy:     Head:     Right side of head: No submandibular, preauricular or posterior auricular adenopathy.     Left side of head: No submandibular, preauricular or posterior auricular adenopathy.     Cervical: No cervical adenopathy.     Right cervical: No superficial or posterior cervical adenopathy.    Left cervical: No superficial or posterior cervical adenopathy.     Upper Body:     Right upper body: No supraclavicular adenopathy.     Left upper body: No supraclavicular adenopathy.  Skin:    General: Skin is warm and dry.     Capillary Refill: Capillary refill takes less than 2 seconds.     Coloration: Skin is not jaundiced or pale.     Findings: No bruising, erythema, lesion or rash.     Comments: No rashes over arms, back, hands/palms, soles/feet  Neurological:     General: No focal deficit present.     Mental Status: She is alert and oriented to person, place, and time.  Psychiatric:        Mood and Affect: Mood normal.        Behavior: Behavior normal.    Assessment and Plan:  Jenna Mcgrath is a 33 y.o. female presenting to the [facility] Department for STI screening  Positive RPR test Assessment & Plan: No rashes or other stigmata of syphilis. No chart evidence of previous RPR test within the last year, however patient has MyChart result from initial prenatal 04/07/2023 with non-reactive RPR. Per state consultant Tiera, will need 3 shots bicillin  and re-collect of state RPR labs. Will discuss again with state consultant, because of nonreactive RPR in December, should need one bicillin  I believe.   Orders: -     Syphilis Serology, [facility] -     Penicillin  G Benzathine  Syphilis affecting pregnancy in third trimester -     Penicillin  G  Benzathine  Patient accepted all screenings including  vaginal CT/GC and bloodwork for HIV/RPR, and wet prep. Patient meets criteria for HepB screening? No. Ordered? not applicable Patient meets criteria for HepC screening? No. Ordered? not applicable  Treat wet prep per standing order Discussed time line for State Lab results and that patient will be called with positive results and encouraged patient to call if she had not heard in 2 weeks.  Counseled to return or seek care for continued or worsening symptoms Recommended repeat testing in 3 months with positive results. Recommended condom use with all sex for STI prevention.   Patient is currently pregnant.   No follow-ups on file.  No future appointments.  Jack Marts, MD

## 2023-08-07 NOTE — Progress Notes (Signed)
 Pt is here for STD screening. The patient was dispensed Bicillin  2.4MU injection once today. I provided counseling today regarding the injection and was given at the L+RUOQ. Pt tolerated well to injection. Patient given the opportunity to ask questions for any clarification. Condoms declined. Austine Lefort, RN.

## 2023-08-09 ENCOUNTER — Telehealth: Payer: Self-pay | Admitting: Family Medicine

## 2023-08-09 NOTE — Telephone Encounter (Signed)
 Contacted state syphilis consultant regarding this patient's syphilis treatment. The state will accept the negative RPR test from her initial prenatal in December 2024 as evidence for needing only one bicillin  injection for complete treatment.   I was unable to view results in my system, patient needed to show me on her phone MyChart app. The state was able to pull the [place] labs.   She already received bicillin  2.4 million units x1 on 08/07/23 and this is sufficient. No further treatment needed.   Tempie Fee, MD 08/10/23  6:40 PM

## 2023-08-11 ENCOUNTER — Telehealth: Payer: Self-pay | Admitting: Family Medicine

## 2023-08-11 ENCOUNTER — Telehealth: Payer: Self-pay

## 2023-08-11 NOTE — Telephone Encounter (Signed)
 Patient has been informed about the treatment.

## 2023-09-06 NOTE — Progress Notes (Signed)
 Obstetrics & Gynecology Office Visit  Subjective  Jenna Mcgrath is a 33 y.o. [phone] at [redacted]w[redacted]d being seen today for ongoing prenatal care.  She is currently monitored for this high-risk pregnancy.  Patient's last menstrual period was 01/12/2023. Estimated Date of Delivery: 10/19/23  Patient concerns today: Patient expresses concerns of twitching in left eye for two weeks and pain under left rib.   Pt denies contractions, vaginal bleeding, leaking fluid. Endorses good fetal movement Pt denies HA, VD or RUQ pain.   Objective  LMP 01/12/2023    Pre-pregnant weight: 107 kg (236 lb)  TWG: 15.4 kg (34 lb)  Gen: NAD  Pulm: No use of accessory muscles, normal respirations Abdomen: Gravid, nontender Ext : Mild edema, no rashes.   Psych: Mood, insight, judgement intact SVE: Deferred   NST performed today (09/06/2023) FHR baseline: 125-135 bpm Variability: moderate Accelerations: yes Decelerations: none Time: approximately 1 hour 9 minutes Category/reactivity: reactive  Assessment   33 y.o. [id number] at [redacted]w[redacted]d by  10/19/2023, by Last Menstrual Period presenting for routine prenatal visit.   Plan   Problem list reviewed and/or updated  1. Encounter for supervision of other normal pregnancy in third trimester (HHS-HCC) - Fetal kick counts and preterm labor precautions reviewed.   2. Primary Syphilis - Plan to repeat RPR approximately 10/02/2023 and at time of     Delivery - NST completed today (09/06/2023). NST reactive.   3. Obesity in pregnancy, antepartum (HHS-HCC) -  BMI:42.28 - TWG: 15.4 kg (34 lb) - TWGE: 5 kg (11 lb)-9 kg (19 lb)  4. Restless leg syndrome in pregnancy (HHS-HCC) - Currently taking Clonazepam 0.25 mg PRN at night (09/06/2023).   5. Anemia affecting pregnancy in third trimester (HHS-HCC) - Currently taking iron supplements PO daily (09/06/2023)   - Hgb: 10.6 (08/01/2023)  6. Polyhydramnios, antepartum, single or unspecified fetus (HHS-HCC) - Will plan to  schedule weekly NSTs.  - Next Growth US  scheduled for 09/26/2023.   7.  Eye muscle twitches - Patient encouraged that eye muscle twitches may be due to lack of sleep or stressful situations. Patient    verbalized understanding. - Patient instructed to notify OB provider if symptoms worsen or do not improve.    8. Rib pain on left side - Patient advised to change positions frequently and that the use of a heating pad to area (on lowest setting)     or massage may provide relief. Patient verbalized understanding.  - Patient instructed to notify OB provider if symptoms worsen or do not improve.     Return in about 2 weeks (around 09/20/2023) for Routine Prenatal, NST'S/BPP.   Attestation Statement:   I personally performed the service, non-incident to. (WP)   JAZMINE SIMONE [place], CNM

## 2023-09-06 NOTE — Progress Notes (Signed)
 Jenna Mcgrath is a 4 y.n.H3E6976 at [redacted]w[redacted]d presenting for an NST for primary syphilis @ 28 weeks.  NST performed today (09/06/2023) FHR baseline: 125-135 bpm Variability: moderate Accelerations: yes Decelerations: none Time: approximately 1 hour 9 minutes Category/reactivity: reactive  RESULTS:  A NST procedure was performed with FHR monitoring and a normal baseline established, appropriate time of 20-40 minutes of evaluation, and accels >2 seen w 15x15 characteristics.  Results show a REACTIVE NST.    Attestation Statement:   I personally performed the service, non-incident to. [facility])   JAZMINE SIMONE [place], CNM  09/06/2023 4:21 PM

## 2023-09-26 LAB — OB RESULTS CONSOLE GC/CHLAMYDIA
Chlamydia: NEGATIVE
Neisseria Gonorrhea: NEGATIVE

## 2023-09-26 LAB — OB RESULTS CONSOLE GBS: GBS: NEGATIVE

## 2023-09-26 NOTE — Progress Notes (Signed)
 Obstetrics & Gynecology Office Visit  Subjective  Jenna Mcgrath is a 33 y.o. [phone] at [redacted]w[redacted]d being seen today for ongoing prenatal care.  She is currently monitored for tthis high-risk pregnancy.  Patient's last menstrual period was 01/12/2023. Estimated Date of Delivery: 10/19/23  History of Present Illness   Patient concerns today: having intermittent contractions  Pt denies contractions, vaginal bleeding, leaking fluid. Endorses good fetal movementfrequent Pt denies HA, VD or RUQ pain.   Objective  BP 112/78   Pulse 106   Ht 170.2 cm (5' 7.01)   Wt (!) 124.9 kg (275 lb 6.4 oz)   LMP 01/12/2023   BMI 43.12 kg/m    Fetal Status:          Pre-pregnant weight: 107 kg (236 lb)  TWG: 17.9 kg (39 lb 6.4 oz)  PE Chaperone note: A chaperone was included for sensitive portions of the exam- staff member name: [place]: D Corson CMA  Chaperone present for pelvic exam.  Gen: NAD  Pulm: No use of accessory muscles, normal respirations Abdomen: Gravid, nontender Ext : no edema, no rashes.   Psych: Mood, insight, judgement intact SVE: deferred NST: chronic hypertension Baseline FHR: 135 beats/min Variability: moderate Accelerations: present Decelerations: absent Tocometry: none  Interpretation: reactive RESULTS:  A NST procedure was performed with FHR monitoring and a normal baseline established, appropriate time of 20-40 minutes of evaluation, and accels >2 seen w 15x15 characteristics.  Results show a REACTIVE NST.   Bobbette Brunswick CNM  ULTRASOUND REPORT  Location: [facility] Date of Service: 09/26/2023   Indications: Findings:  Singleton intrauterine pregnancy is visualized with FHR at 139 BPM.   Biometrics give an (U/S) Gestational age of [redacted]w[redacted]d and an (U/S) EDD of 10/19/23; this correlates with the clinically established Estimated Date of Delivery: 10/19/23.   Fetal presentation is Cephalic.  Placenta: anterior.  AFI: 22.6 cm  Growth percentile is 80.  AC  percentile is 96. EFW: 3294 g, 7 lbs 4 oz  Impression: 1. [redacted]w[redacted]d Viable Singleton Intrauterine pregnancy previously established criteria. 2. Growth is 80 %ile.  AFI is 22.6 cm.   Biophysical Profile: Fetal breathing movement 2/2 Fetal movement 2/2 Fetal tone 2/2 AFI 2/2 NST 2/2 BPP Score 10/10  Bobbette Brunswick [facility]    Assessment   33 y.o. H3E6976 at [redacted]w[redacted]d by  10/19/2023, by Last Menstrual Period presenting for work-in prenatal visit  Plan   Problem list reviewed and/or updated   Assessment & Plan     Orders Placed This Encounter  Procedures  . Strep Gp B NAA  - [facility]    Release to patient:   Immediate  . Xpert CT/NG, PCR - [facility]    Release to patient:   Immediate  . CBC w/auto Differential (5 Part)    Release to patient:   Immediate    1. Encounter for supervision of high risk pregnancy in third trimester, antepartum (HHS-HCC)  -HSV prophylaxis not indicated -GBS CT/GC collected.   -Planning family members: significant other for labor support;  -Labor plans reviewed.  Desires epidural, IVPM, nitrous oxide -Feeding preferences reviewed. Desires breast -Peds picked fdpeds: [facility] Kick counts reviewed with the patient in detail.  Patient instructed to assess for fetal activity daily at regular intervals.  Counts to be done if decreased activity noted.  Patient instructed to contact the office if counts do not reveal adequate movements. Labor precautions (ROM, vaginal bleeding, s/s labor) reviewed.  -     Strep Gp B NAA  -  [facility] -     Xpert CT/NG, PCR - [facility] -     CBC w/auto Differential (5 Part)  2. Chronic hypertension affecting pregnancy (HHS-HCC) Upon chart review at 16 weeks, patient meets criteria for cHTN due to multiple elevated BP outside of and in early pregnancy. Discussed diagnosis on 1/21. Baseline preE labs wnl (PCR 73) on 04/07/23 Started ASA 81 mg 1/21 Patient has BP cuff and will start checking and bring log.  Discussed to alert us  if BP routinely >140/90 Reported severe ranges at home- repeat preE labs ordered 05/09/23 Surveillance delivery planning TBD by control  3. Obesity (BMI 35.0-39.9 without comorbidity), unspecified -17.9 kg (39 lb 6.4 oz)   4. Primary syphilis Reactive T. 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  Repeat RPR 8 weeks from treatment (08/07/23) and at time of delivery Weekly NSTS starting at 32 weeks  5. Restless leg syndrome in pregnancy (HHS-HCC) Clonazepam 0.25 mg PRN at night   6. Anemia affecting pregnancy in third trimester (HHS-HCC)  Hgb 10.6 @ 28 wks, told to start taking iron supplements 08/01/23   7. Polyhydramnios, antepartum, single or unspecified fetus (HHS-HCC) afi 25 on 08/29/23- cont weekly nst     FKC reviewed.  Pre-term labor precautions reviewed.  and Call with bleeding, contractions, PROM, or decreased fetal movement.   Return in about 1 week (around 10/03/2023) for Routine PNC.   Attestation Statement:   I personally performed the service, non-incident to. (WP)   [name] L DICKERSON, CNM

## 2023-10-12 NOTE — Progress Notes (Signed)
 Patient's last menstrual period was 01/12/2023. Estimated Date of Delivery: 10/19/23  33 y.o. H3E6976 at [redacted]w[redacted]d  The primary encounter diagnosis was Supervision of high risk pregnancy in third trimester (HHS-HCC). Diagnoses of Chronic hypertension affecting pregnancy (HHS-HCC), Obesity in pregnancy, antepartum (HHS-HCC), Anemia affecting pregnancy in third trimester (HHS-HCC), Short interval between pregnancies complicating pregnancy, antepartum (HHS-HCC), Primary syphilis, and Encounter for supervision of high risk pregnancy in third trimester, antepartum (HHS-HCC) were also pertinent to this visit.  S:   Patient concerns today:  - feeling more pelvic pressure and discomfort  Chief Complaint  Patient presents with  . Non-stress Test  . Routine Prenatal Visit    Reports: good fetal movement, occasional contractions, pelvic pressure  Denies bleeding, contractions, cramping or leaking.   O:   See [facility] flowsheets. BP 137/88   Pulse 99   Ht 170.2 cm (5' 7.01)   Wt (!) 125.6 kg (277 lb)   LMP 01/12/2023   BMI 43.37 kg/m  Gen: NAD  Pulm: No use of accessory muscles, normal respirations Abdomen: Gravid, nontender  Fundal height: 40cm  S=D Pelvic: SVE 3/50/-3, posterior, medium consistency Ext : mild edema, no rashes Psych: Mood, insight, judgement intact  NST: for cHTN and obesity in pregnancy Baseline: 130 bpm Variability: moderate Accels: >2 15x15  Decels: none Time: 20 min Category I NST reactive  A/P:  33 y.o. H3E6976 at [redacted]w[redacted]d   - Problem list reviewed and/or updated.  1. Supervision of high risk pregnancy in third trimester (HHS-HCC) GBS negative. Labor plans reviewed; planning epidural. Kick counts reviewed with the patient in detail.  Patient instructed to assess for fetal activity daily at regular intervals.  Counts to be done if decreased activity noted.  Patient instructed to contact the office if counts do not reveal adequate movements. Labor  precautions (ROM, vaginal bleeding, s/s labor) reviewed.  Pt verbalized understanding.  2. Chronic hypertension affecting pregnancy (HHS-HCC) No medications Weekly NST until delivery  3. Obesity in pregnancy, antepartum (HHS-HCC) TWG: 18.6 kg (41 lb) Body mass index is 43.37 kg/m.  4. Anemia affecting pregnancy in third trimester (HHS-HCC) Hgb 10.8 on 09/26/23, encouraged continued supplementation  5. Short interval between pregnancies complicating pregnancy, antepartum (HHS-HCC) Growth US  09/26/23 showed EFW 3294g (80%)  6. Primary syphilis Treated in pregnancy    Call with bleeding, contractions, PROM, or decreased fetal movement.   Return in about 1 week (around 10/19/2023) for routine Prenatal, NST.   I personally performed the service, non-incident to. [facility])   EDSEL CHARLIES BLUSH , CNM 10/12/2023 2:44 PM

## 2023-10-18 ENCOUNTER — Inpatient Hospital Stay
Admission: EM | Admit: 2023-10-18 | Discharge: 2023-10-20 | DRG: 807 | Disposition: A | Discharge status: Home / Self Care | Attending: Obstetrics | Admitting: Obstetrics

## 2023-10-18 DIAGNOSIS — O479 False labor, unspecified: Secondary | ICD-10-CM | POA: Diagnosis present

## 2023-10-18 DIAGNOSIS — F419 Anxiety disorder, unspecified: Secondary | ICD-10-CM | POA: Diagnosis present

## 2023-10-18 DIAGNOSIS — O99334 Smoking (tobacco) complicating childbirth: Secondary | ICD-10-CM | POA: Diagnosis present

## 2023-10-18 DIAGNOSIS — G2581 Restless legs syndrome: Secondary | ICD-10-CM | POA: Diagnosis present

## 2023-10-18 DIAGNOSIS — K219 Gastro-esophageal reflux disease without esophagitis: Secondary | ICD-10-CM | POA: Diagnosis present

## 2023-10-18 DIAGNOSIS — R7309 Other abnormal glucose: Secondary | ICD-10-CM

## 2023-10-18 DIAGNOSIS — A530 Latent syphilis, unspecified as early or late: Secondary | ICD-10-CM | POA: Diagnosis present

## 2023-10-18 DIAGNOSIS — O99344 Other mental disorders complicating childbirth: Secondary | ICD-10-CM | POA: Diagnosis present

## 2023-10-18 DIAGNOSIS — O10919 Unspecified pre-existing hypertension complicating pregnancy, unspecified trimester: Secondary | ICD-10-CM | POA: Diagnosis present

## 2023-10-18 DIAGNOSIS — F17290 Nicotine dependence, other tobacco product, uncomplicated: Secondary | ICD-10-CM | POA: Diagnosis present

## 2023-10-18 DIAGNOSIS — K429 Umbilical hernia without obstruction or gangrene: Secondary | ICD-10-CM

## 2023-10-18 DIAGNOSIS — O99019 Anemia complicating pregnancy, unspecified trimester: Secondary | ICD-10-CM

## 2023-10-18 DIAGNOSIS — O09899 Supervision of other high risk pregnancies, unspecified trimester: Secondary | ICD-10-CM

## 2023-10-18 DIAGNOSIS — Z833 Family history of diabetes mellitus: Secondary | ICD-10-CM

## 2023-10-18 DIAGNOSIS — Z3A40 40 weeks gestation of pregnancy: Secondary | ICD-10-CM

## 2023-10-18 DIAGNOSIS — O9962 Diseases of the digestive system complicating childbirth: Secondary | ICD-10-CM | POA: Diagnosis present

## 2023-10-18 DIAGNOSIS — E66813 Obesity, class 3: Secondary | ICD-10-CM | POA: Diagnosis present

## 2023-10-18 DIAGNOSIS — O1092 Unspecified pre-existing hypertension complicating childbirth: Principal | ICD-10-CM | POA: Diagnosis present

## 2023-10-18 DIAGNOSIS — O99214 Obesity complicating childbirth: Secondary | ICD-10-CM | POA: Diagnosis present

## 2023-10-18 DIAGNOSIS — Z8249 Family history of ischemic heart disease and other diseases of the circulatory system: Secondary | ICD-10-CM

## 2023-10-18 DIAGNOSIS — O9902 Anemia complicating childbirth: Secondary | ICD-10-CM | POA: Diagnosis present

## 2023-10-18 DIAGNOSIS — Z0371 Encounter for suspected problem with amniotic cavity and membrane ruled out: Principal | ICD-10-CM

## 2023-10-18 DIAGNOSIS — O326XX0 Maternal care for compound presentation, not applicable or unspecified: Secondary | ICD-10-CM | POA: Diagnosis present

## 2023-10-18 LAB — CBC
HCT: 34.7 % — ABNORMAL LOW (ref 36.0–46.0)
Hemoglobin: 10.6 g/dL — ABNORMAL LOW (ref 12.0–15.0)
MCH: 25.5 pg — ABNORMAL LOW (ref 26.0–34.0)
MCHC: 30.5 g/dL (ref 30.0–36.0)
MCV: 83.4 fL (ref 80.0–100.0)
Platelets: 323 10*3/uL (ref 150–400)
RBC: 4.16 MIL/uL (ref 3.87–5.11)
RDW: 17.3 % — ABNORMAL HIGH (ref 11.5–15.5)
WBC: 10.6 10*3/uL — ABNORMAL HIGH (ref 4.0–10.5)
nRBC: 0.3 % — ABNORMAL HIGH (ref 0.0–0.2)

## 2023-10-18 MED ORDER — ACETAMINOPHEN 325 MG PO TABS: 650.0000 mg | ORAL_TABLET | ORAL | Status: DC | PRN

## 2023-10-18 MED ORDER — CALCIUM CARBONATE ANTACID 500 MG PO CHEW
2.0000 | CHEWABLE_TABLET | ORAL | Status: DC | PRN
  Administered 2023-10-19: 400 mg via ORAL
  Filled 2023-10-18 (×2): qty 2

## 2023-10-18 NOTE — OB Triage Note (Signed)
 Pt Jenna Mcgrath 33 y.o. presents to labor and delivery triage reporting  ctx 2-3 minutes starting around 4pm tonight. Pt is a H3E6976 at [redacted]w[redacted]d . Pt denies signs and symptons consistent with rupture of membranes or active vaginal bleeding. Pt states positive fetal movement. External FM and TOCO applied to non-tender abdomen and assessing. Initial FHR 140 . Vital signs obtained. Provider notified of pt.

## 2023-10-19 ENCOUNTER — Inpatient Hospital Stay: Admitting: Anesthesiology

## 2023-10-19 ENCOUNTER — Encounter: Payer: Self-pay | Admitting: Obstetrics and Gynecology

## 2023-10-19 ENCOUNTER — Other Ambulatory Visit: Payer: Self-pay

## 2023-10-19 DIAGNOSIS — Z8249 Family history of ischemic heart disease and other diseases of the circulatory system: Secondary | ICD-10-CM | POA: Diagnosis not present

## 2023-10-19 DIAGNOSIS — O99214 Obesity complicating childbirth: Secondary | ICD-10-CM | POA: Diagnosis present

## 2023-10-19 DIAGNOSIS — Z833 Family history of diabetes mellitus: Secondary | ICD-10-CM | POA: Diagnosis not present

## 2023-10-19 DIAGNOSIS — O09899 Supervision of other high risk pregnancies, unspecified trimester: Secondary | ICD-10-CM

## 2023-10-19 DIAGNOSIS — O9902 Anemia complicating childbirth: Secondary | ICD-10-CM | POA: Diagnosis present

## 2023-10-19 DIAGNOSIS — O26893 Other specified pregnancy related conditions, third trimester: Secondary | ICD-10-CM | POA: Diagnosis present

## 2023-10-19 DIAGNOSIS — K219 Gastro-esophageal reflux disease without esophagitis: Secondary | ICD-10-CM | POA: Diagnosis present

## 2023-10-19 DIAGNOSIS — O99019 Anemia complicating pregnancy, unspecified trimester: Secondary | ICD-10-CM

## 2023-10-19 DIAGNOSIS — O99344 Other mental disorders complicating childbirth: Secondary | ICD-10-CM | POA: Diagnosis present

## 2023-10-19 DIAGNOSIS — Z3A40 40 weeks gestation of pregnancy: Secondary | ICD-10-CM | POA: Diagnosis not present

## 2023-10-19 DIAGNOSIS — R7309 Other abnormal glucose: Secondary | ICD-10-CM

## 2023-10-19 DIAGNOSIS — K429 Umbilical hernia without obstruction or gangrene: Secondary | ICD-10-CM

## 2023-10-19 DIAGNOSIS — E66813 Obesity, class 3: Secondary | ICD-10-CM | POA: Diagnosis present

## 2023-10-19 DIAGNOSIS — O1092 Unspecified pre-existing hypertension complicating childbirth: Secondary | ICD-10-CM | POA: Diagnosis present

## 2023-10-19 DIAGNOSIS — O326XX0 Maternal care for compound presentation, not applicable or unspecified: Secondary | ICD-10-CM | POA: Diagnosis present

## 2023-10-19 DIAGNOSIS — F17290 Nicotine dependence, other tobacco product, uncomplicated: Secondary | ICD-10-CM | POA: Diagnosis present

## 2023-10-19 DIAGNOSIS — F419 Anxiety disorder, unspecified: Secondary | ICD-10-CM | POA: Diagnosis present

## 2023-10-19 DIAGNOSIS — O9962 Diseases of the digestive system complicating childbirth: Secondary | ICD-10-CM | POA: Diagnosis present

## 2023-10-19 DIAGNOSIS — O99334 Smoking (tobacco) complicating childbirth: Secondary | ICD-10-CM | POA: Diagnosis present

## 2023-10-19 LAB — PROTEIN / CREATININE RATIO, URINE
Creatinine, Urine: 179 mg/dL
Protein Creatinine Ratio: 0.08 mg/mg{creat} (ref 0.00–0.15)
Total Protein, Urine: 15 mg/dL

## 2023-10-19 LAB — COMPREHENSIVE METABOLIC PANEL WITH GFR
ALT: 10 U/L (ref 0–44)
AST: 18 U/L (ref 15–41)
Albumin: 2.6 g/dL — ABNORMAL LOW (ref 3.5–5.0)
Alkaline Phosphatase: 194 U/L — ABNORMAL HIGH (ref 38–126)
Anion gap: 8 (ref 5–15)
BUN: 8 mg/dL (ref 6–20)
CO2: 23 mmol/L (ref 22–32)
Calcium: 8.4 mg/dL — ABNORMAL LOW (ref 8.9–10.3)
Chloride: 104 mmol/L (ref 98–111)
Creatinine, Ser: 0.85 mg/dL (ref 0.44–1.00)
GFR, Estimated: >60 mL/min (ref >60)
Glucose, Bld: 91 mg/dL (ref 70–99)
Potassium: 3.7 mmol/L (ref 3.5–5.1)
Sodium: 135 mmol/L (ref 135–145)
Total Bilirubin: 0.3 mg/dL (ref 0.0–1.2)
Total Protein: 6.9 g/dL (ref 6.5–8.1)

## 2023-10-19 LAB — TYPE AND SCREEN
ABO/RH(D): A POS
Antibody Screen: NEGATIVE

## 2023-10-19 LAB — RPR: RPR Ser Ql: NONREACTIVE

## 2023-10-19 MED ORDER — TRANEXAMIC ACID-NACL 1000-0.7 MG/100ML-% IV SOLN
INTRAVENOUS | Status: AC
  Filled 2023-10-19: qty 100

## 2023-10-19 MED ORDER — FENTANYL-BUPIVACAINE-NACL 0.5-0.125-0.9 MG/250ML-% EP SOLN
12.0000 mL/h | EPIDURAL | Status: DC | PRN
  Administered 2023-10-19: 12 mL/h via EPIDURAL

## 2023-10-19 MED ORDER — PRENATAL MULTIVITAMIN CH
1.0000 | ORAL_TABLET | Freq: Every day | ORAL | Status: DC
  Administered 2023-10-19 – 2023-10-20 (×2): 1 via ORAL
  Filled 2023-10-19 (×2): qty 1

## 2023-10-19 MED ORDER — DIPHENHYDRAMINE HCL 25 MG PO CAPS: 25.0000 mg | ORAL_CAPSULE | Freq: Four times a day (QID) | ORAL | Status: DC | PRN

## 2023-10-19 MED ORDER — OXYTOCIN 10 UNIT/ML IJ SOLN
INTRAMUSCULAR | Status: AC
  Filled 2023-10-19: qty 2

## 2023-10-19 MED ORDER — SODIUM CHLORIDE 0.9 % IV SOLN
INTRAVENOUS | Status: DC | PRN
  Administered 2023-10-19 (×2): 5 mL via EPIDURAL

## 2023-10-19 MED ORDER — FENTANYL-BUPIVACAINE-NACL 0.5-0.125-0.9 MG/250ML-% EP SOLN
EPIDURAL | Status: AC
  Filled 2023-10-19: qty 250

## 2023-10-19 MED ORDER — ONDANSETRON HCL 4 MG/2ML IJ SOLN
4.0000 mg | Freq: Four times a day (QID) | INTRAMUSCULAR | Status: DC | PRN
  Administered 2023-10-19: 4 mg via INTRAVENOUS
  Filled 2023-10-19: qty 2

## 2023-10-19 MED ORDER — FENTANYL CITRATE (PF) 100 MCG/2ML IJ SOLN: 50.0000 ug | INTRAMUSCULAR | Status: DC | PRN

## 2023-10-19 MED ORDER — CLONAZEPAM 0.5 MG PO TABS
0.5000 mg | ORAL_TABLET | Freq: Once | ORAL | Status: AC
  Administered 2023-10-19: 0.5 mg via ORAL
  Filled 2023-10-19 (×2): qty 1

## 2023-10-19 MED ORDER — LACTATED RINGERS IV SOLN: 500.0000 mL | Freq: Once | INTRAVENOUS | Status: AC

## 2023-10-19 MED ORDER — PHENYLEPHRINE 80 MCG/ML (10ML) SYRINGE FOR IV PUSH (FOR BLOOD PRESSURE SUPPORT): 80.0000 ug | PREFILLED_SYRINGE | INTRAVENOUS | Status: DC | PRN

## 2023-10-19 MED ORDER — LIDOCAINE HCL (PF) 1 % IJ SOLN
INTRAMUSCULAR | Status: DC | PRN
Start: 2023-10-19 — End: 2023-10-19
  Administered 2023-10-19: 3 mL via SUBCUTANEOUS

## 2023-10-19 MED ORDER — ONDANSETRON HCL 4 MG/2ML IJ SOLN: 4.0000 mg | INTRAMUSCULAR | Status: DC | PRN

## 2023-10-19 MED ORDER — SOD CITRATE-CITRIC ACID 500-334 MG/5ML PO SOLN: 30.0000 mL | ORAL | Status: DC | PRN

## 2023-10-19 MED ORDER — LIDOCAINE HCL (PF) 1 % IJ SOLN
30.0000 mL | INTRAMUSCULAR | Status: DC | PRN
  Filled 2023-10-19: qty 30

## 2023-10-19 MED ORDER — FAMOTIDINE 20 MG PO TABS
20.0000 mg | ORAL_TABLET | Freq: Every day | ORAL | Status: DC
  Administered 2023-10-19 – 2023-10-20 (×2): 20 mg via ORAL
  Filled 2023-10-19 (×2): qty 1

## 2023-10-19 MED ORDER — ONDANSETRON HCL 4 MG PO TABS: 4.0000 mg | ORAL_TABLET | ORAL | Status: DC | PRN

## 2023-10-19 MED ORDER — EPHEDRINE 5 MG/ML INJ: 10.0000 mg | INTRAVENOUS | Status: DC | PRN

## 2023-10-19 MED ORDER — ACETAMINOPHEN 325 MG PO TABS
650.0000 mg | ORAL_TABLET | ORAL | Status: DC | PRN
  Administered 2023-10-19: 650 mg via ORAL
  Filled 2023-10-19: qty 2

## 2023-10-19 MED ORDER — SIMETHICONE 80 MG PO CHEW
80.0000 mg | CHEWABLE_TABLET | ORAL | Status: DC | PRN
  Administered 2023-10-19 – 2023-10-20 (×2): 80 mg via ORAL
  Filled 2023-10-19 (×2): qty 1

## 2023-10-19 MED ORDER — LIDOCAINE-EPINEPHRINE (PF) 1.5 %-1:200000 IJ SOLN
INTRAMUSCULAR | Status: DC | PRN
  Administered 2023-10-19: 3 mL via EPIDURAL

## 2023-10-19 MED ORDER — DIPHENHYDRAMINE HCL 50 MG/ML IJ SOLN: 12.5000 mg | INTRAMUSCULAR | Status: DC | PRN

## 2023-10-19 MED ORDER — LACTATED RINGERS IV SOLN
500.0000 mL | INTRAVENOUS | Status: DC | PRN
  Administered 2023-10-19: 1000 mL via INTRAVENOUS

## 2023-10-19 MED ORDER — HYDROCORTISONE ACETATE 25 MG RE SUPP: 25.0000 mg | Freq: Two times a day (BID) | RECTAL | Status: DC | PRN

## 2023-10-19 MED ORDER — ZOLPIDEM TARTRATE 5 MG PO TABS: 5.0000 mg | ORAL_TABLET | Freq: Every evening | ORAL | Status: DC | PRN

## 2023-10-19 MED ORDER — SENNOSIDES-DOCUSATE SODIUM 8.6-50 MG PO TABS
2.0000 | ORAL_TABLET | Freq: Every day | ORAL | Status: DC
  Administered 2023-10-20: 2 via ORAL
  Filled 2023-10-19: qty 2

## 2023-10-19 MED ORDER — LACTATED RINGERS IV SOLN: INTRAVENOUS | Status: DC

## 2023-10-19 MED ORDER — WITCH HAZEL-GLYCERIN EX PADS
1.0000 | MEDICATED_PAD | CUTANEOUS | Status: DC | PRN
  Administered 2023-10-19 – 2023-10-20 (×3): 1 via TOPICAL
  Filled 2023-10-19 (×3): qty 100

## 2023-10-19 MED ORDER — OXYTOCIN-SODIUM CHLORIDE 30-0.9 UT/500ML-% IV SOLN
2.5000 [IU]/h | INTRAVENOUS | Status: DC
  Filled 2023-10-19: qty 500

## 2023-10-19 MED ORDER — BENZOCAINE-MENTHOL 20-0.5 % EX AERO
1.0000 | INHALATION_SPRAY | CUTANEOUS | Status: DC | PRN
  Administered 2023-10-19 (×2): 1 via TOPICAL
  Filled 2023-10-19 (×2): qty 56

## 2023-10-19 MED ORDER — OXYCODONE HCL 5 MG PO TABS
5.0000 mg | ORAL_TABLET | Freq: Four times a day (QID) | ORAL | Status: DC | PRN
  Administered 2023-10-19 – 2023-10-20 (×4): 5 mg via ORAL
  Filled 2023-10-19 (×4): qty 1

## 2023-10-19 MED ORDER — CALCIUM CARBONATE ANTACID 500 MG PO CHEW
400.0000 mg | CHEWABLE_TABLET | Freq: Three times a day (TID) | ORAL | Status: DC | PRN
  Administered 2023-10-19 (×2): 400 mg via ORAL
  Filled 2023-10-19 (×2): qty 2

## 2023-10-19 MED ORDER — DIBUCAINE (PERIANAL) 1 % EX OINT
1.0000 | TOPICAL_OINTMENT | CUTANEOUS | Status: DC | PRN
  Administered 2023-10-19: 1 via RECTAL
  Filled 2023-10-19 (×2): qty 28

## 2023-10-19 MED ORDER — ACETAMINOPHEN 325 MG PO TABS: 650.0000 mg | ORAL_TABLET | ORAL | Status: DC | PRN

## 2023-10-19 MED ORDER — MISOPROSTOL 200 MCG PO TABS
ORAL_TABLET | ORAL | Status: AC
  Filled 2023-10-19: qty 4

## 2023-10-19 MED ORDER — OXYTOCIN BOLUS FROM INFUSION
333.0000 mL | Freq: Once | INTRAVENOUS | Status: AC
  Administered 2023-10-19: 333 mL via INTRAVENOUS

## 2023-10-19 MED ORDER — IBUPROFEN 600 MG PO TABS
600.0000 mg | ORAL_TABLET | Freq: Four times a day (QID) | ORAL | Status: DC
  Administered 2023-10-19 – 2023-10-20 (×5): 600 mg via ORAL
  Filled 2023-10-19 (×5): qty 1

## 2023-10-19 MED ORDER — COCONUT OIL OIL: 1.0000 | TOPICAL_OIL | Status: DC | PRN

## 2023-10-19 MED ORDER — HYDROXYZINE HCL 25 MG PO TABS
25.0000 mg | ORAL_TABLET | Freq: Four times a day (QID) | ORAL | Status: DC | PRN
  Filled 2023-10-19: qty 1

## 2023-10-19 NOTE — Anesthesia Preprocedure Evaluation (Signed)
 Anesthesia Evaluation  Patient identified by MRN, date of birth, ID band Patient awake    Reviewed: Allergy & Precautions, H&P , NPO status , Patient's Chart, lab work & pertinent test results, reviewed documented beta blocker date and time   History of Anesthesia Complications Negative for: history of anesthetic complications  Airway Mallampati: I  TM Distance: >3 FB Neck ROM: full    Dental no notable dental hx.    Pulmonary neg shortness of breath, neg sleep apnea, neg COPD, neg recent URI, Current Smoker   Pulmonary exam normal breath sounds clear to auscultation       Cardiovascular Exercise Tolerance: Good hypertension, (-) angina (-) Past MI and (-) Cardiac Stents Normal cardiovascular exam(-) dysrhythmias (-) Valvular Problems/Murmurs Rhythm:regular Rate:Normal     Neuro/Psych negative neurological ROS  negative psych ROS   GI/Hepatic Neg liver ROS,GERD  ,,  Endo/Other  neg diabetes  Class 3 obesity  Renal/GU Renal disease (kidney stones)  negative genitourinary   Musculoskeletal   Abdominal   Peds  Hematology negative hematology ROS (+)   Anesthesia Other Findings Past Medical History: 06/20/2017: Encounter for initial prescription of Nexplanon      Comment:  Lot # [id number]  [id number]  Expiration Date Nov 29 2019   11/16/2021: Encounter for supervision of normal pregnancy in second  trimester     Comment:  Formatting of this note might be different from the               original. 33 y.o. H4E7977 at Patient's last menstrual               period was 08/23/2021 (exact date). consistent with               ultrasound @ [redacted]w[redacted]d.Estimated Date of Delivery:05/30/22.               Sex of baby and name: Jenna Mcgrath   Partner: Keven  Wants               Jenna Mcgrath to deliver Factors complicating this pregnancy               History of placenta previa with 2 prior pregnancies               (resolved by delivery  No  date: Medical history non-contributory   Reproductive/Obstetrics (+) Pregnancy                              Anesthesia Physical Anesthesia Plan  ASA: 3  Anesthesia Plan: Epidural   Post-op Pain Management:    Induction:   PONV Risk Score and Plan:   Airway Management Planned:   Additional Equipment:   Intra-op Plan:   Post-operative Plan:   Informed Consent: I have reviewed the patients History and Physical, chart, labs and discussed the procedure including the risks, benefits and alternatives for the proposed anesthesia with the patient or authorized representative who has indicated his/her understanding and acceptance.     Dental Advisory Given  Plan Discussed with: Anesthesiologist, CRNA and Surgeon  Anesthesia Plan Comments:         Anesthesia Quick Evaluation

## 2023-10-19 NOTE — Lactation Note (Signed)
 This note was copied from a baby's chart. Lactation Consultation Note  Patient Name: Jenna Mcgrath Date: 10/19/2023 Age:32 hours Reason for consult: Initial assessment;Term   Maternal Data Does the patient have breastfeeding experience prior to this delivery?: Yes How long did the patient breastfeed?: 1- 8 months, 2 - 6 months, 3 - 4to54mons  Initial assessment w/ an experienced breastfeeding patient and 10hr old baby Jenna Jenna Mcgrath.  This was a SVD.  Patient w/ a hx of obesity and anemia. Mom is experienced w/ breastfeeding and didn't have any concerns at the time.  Feeding Mother's Current Feeding Choice: Breast Milk  LC observed mom independently latch infant to the rt breast in football hold.  Audible swallows were heard during the feeding.  LATCH Score Latch: Grasps breast easily, tongue down, lips flanged, rhythmical sucking.  Audible Swallowing: Spontaneous and intermittent  Type of Nipple: Everted at rest and after stimulation  Comfort (Breast/Nipple): Soft / non-tender  Hold (Positioning): No assistance needed to correctly position infant at breast.  LATCH Score: 10   Lactation Tools Discussed/Used Patient received a Medela Manual pump.  She stated that she had experience using this particular pump.    Interventions Interventions: Breast feeding basics reviewed;Hand pump;Education  LC provided education on the following;  milk production expectations, hunger cues, day 1/2 wet/dirty diapers, hand expression, cluster feeding, benefits of STS and arousing infant for a feeding.  Lactation informed patient of feeding infant at least 8 or more times w/in a 24hr period but not exceeding 3hrs. Patient verbalized understanding.   Discharge Discharge Education: Engorgement and breast care;Outpatient recommendation Pump: Personal;DEBP (DEBP) WIC Program: Yes  Engorgement treatment/prevention were discussed since this has changed since patient had a her last kid.    Consult Status Consult Status: PRN    Geral Tuch S Saoirse Legere 10/19/2023, 2:29 PM

## 2023-10-19 NOTE — Anesthesia Procedure Notes (Signed)
 Epidural Patient location during procedure: OB Start time: 10/19/2023 1:11 AM End time: 10/19/2023 1:14 AM  Staffing Anesthesiologist: Dario Barter, MD Performed: anesthesiologist   Preanesthetic Checklist Completed: patient identified, IV checked, site marked, risks and benefits discussed, surgical consent, monitors and equipment checked, pre-op evaluation and timeout performed  Epidural Patient position: sitting Prep: ChloraPrep Patient monitoring: heart rate, continuous pulse ox and blood pressure Approach: midline Location: L3-L4 Injection technique: LOR saline  Needle:  Needle type: Tuohy  Needle gauge: 17 G Needle length: 9 cm Needle insertion depth: 6 cm Catheter type: closed end flexible Catheter size: 19 Gauge Catheter at skin depth: 11 cm Test dose: negative and 1.5% lidocaine  with Epi 1:200 K  Assessment Sensory level: T10 Events: blood not aspirated, no cerebrospinal fluid, injection not painful, no injection resistance, no paresthesia and negative IV test  Additional Notes 1st attempt Pt. Evaluated and documentation done after procedure finished. Patient identified. Risks/Benefits/Options discussed with patient including but not limited to bleeding, infection, nerve damage, paralysis, failed block, incomplete pain control, headache, blood pressure changes, nausea, vomiting, reactions to medication both or allergic, itching and postpartum back pain. Confirmed with bedside nurse the patient's most recent platelet count. Confirmed with patient that they are not currently taking any anticoagulation, have any bleeding history or any family history of bleeding disorders. Patient expressed understanding and wished to proceed. All questions were answered. Sterile technique was used throughout the entire procedure. Please see nursing notes for vital signs. Test dose was given through epidural catheter and negative prior to continuing to dose epidural or start infusion. Warning  signs of high block given to the patient including shortness of breath, tingling/numbness in hands, complete motor block, or any concerning symptoms with instructions to call for help. Patient was given instructions on fall risk and not to get out of bed. All questions and concerns addressed with instructions to call with any issues or inadequate analgesia.    Patient tolerated the insertion well without immediate complications.Reason for block:procedure for pain

## 2023-10-19 NOTE — Progress Notes (Signed)
 Labor Progress Note  Kellsey Jaelin Gu is a 33 y.o. [phone] at [redacted]w[redacted]d by LMP admitted for active labor  Subjective: she is comfortable after her epidural, reports she is feeling anxious and tense and requesting something for anxiety  Objective: BP 114/61   Pulse 96   Temp 98.3 F (36.8 C) (Oral)   Resp 14   Ht 5' 7 (1.702 m)   Wt 126.1 kg   LMP 01/12/2023 Comment: [redacted] weeks Gestation  SpO2 99%   BMI 43.54 kg/m  Notable VS details: reviewed  Fetal Assessment: FHT:  FHR: 135 bpm, variability: moderate,  accelerations:  Present,  decelerations:  Absent Category/reactivity:  Category I UC:   regular, every 1-5 minutes SVE:    Dilation: 8cm  Effacement: 80%  Station:  -1  Consistency: soft  Position: middle  Membrane status:AROM @ 0240 Amniotic color: light meconium  Labs: Lab Results  Component Value Date   WBC 10.6 (H) 10/18/2023   HGB 10.6 (L) 10/18/2023   HCT 34.7 (L) 10/18/2023   MCV 83.4 10/18/2023   PLT 323 10/18/2023    Assessment / Plan: 33 year old H3E6976 at [redacted]w[redacted]d with active labor  Labor: AROM with light meconium, good labor progress, ordered hydroxyzine q6hrs PRN for anxiety and Clonazepam 0.5mg  once which she has taken in the pregnancy Preeclampsia:  labs stable Fetal Wellbeing:  Category I Pain Control:  Epidural I/D:  GBS negative Anticipated MOD:  NSVD  Edsel Charlies Blush, CNM 10/19/2023, 2:52 AM

## 2023-10-19 NOTE — Plan of Care (Signed)
  Problem: Education: Goal: Knowledge of disease or condition will improve Outcome: Completed/Met Goal: Knowledge of the prescribed therapeutic regimen will improve Outcome: Completed/Met   Problem: Clinical Measurements: Goal: Complications related to the disease process, condition or treatment will be avoided or minimized Outcome: Completed/Met   Problem: Education: Goal: Knowledge of Childbirth will improve Outcome: Completed/Met Goal: Ability to make informed decisions regarding treatment and plan of care will improve Outcome: Completed/Met Goal: Ability to state and carry out methods to decrease the pain will improve Outcome: Completed/Met   Problem: Coping: Goal: Ability to verbalize concerns and feelings about labor and delivery will improve Outcome: Completed/Met   Problem: Life Cycle: Goal: Ability to make normal progression through stages of labor will improve Outcome: Completed/Met Goal: Ability to effectively push during vaginal delivery will improve Outcome: Completed/Met   Problem: Role Relationship: Goal: Will demonstrate positive interactions with the child Outcome: Completed/Met   Problem: Safety: Goal: Risk of complications during labor and delivery will decrease Outcome: Completed/Met   Problem: Pain Management: Goal: Relief or control of pain from uterine contractions will improve Outcome: Completed/Met

## 2023-10-19 NOTE — Discharge Summary (Signed)
 Postpartum Discharge Summary  Patient Name: Jenna Mcgrath DOB: 1990/08/02 MRN: [id number]  Date of admission: 10/18/2023 Delivery date:10/19/2023 Delivering provider: TANDA HOUSTON RENEE Date of discharge: 10/20/2023  Primary OB: [facility] OFE:Ejupzwu'd last menstrual period was 01/12/2023. EDC Estimated Date of Delivery: 10/19/23 Gestational Age at Delivery: [redacted]w[redacted]d   Admitting diagnosis: Uterine contractions [O47.9] Intrauterine pregnancy: [redacted]w[redacted]d     Secondary diagnosis:   Principal Problem:   NSVD (normal spontaneous vaginal delivery) Active Problems:   HTN in pregnancy, chronic   Restless legs   Positive RPR test   Uterine contractions   Short interval between pregnancies affecting pregnancy, antepartum   Anemia affecting pregnancy   Umbilical hernia   Elevated glucose tolerance test   Discharge Diagnosis: Term Pregnancy Delivered                                                Post partum procedures:none Augmentation:: AROM Complications: None Delivery Type: spontaneous vaginal delivery Anesthesia: epidural anesthesia Placenta: spontaneous To Pathology: No  Laceration: none Episiotomy: none  Prenatal Labs:  Blood type/Rh A pos  Antibody screen neg  Rubella Immune  Varicella Immune  RPR NR  HBsAg Neg  HIV NR  GC neg  Chlamydia neg  Genetic screening negative  1 hour GTT 173  3 hour GTT 81, 157, 123, 44   GBS Negative    Hospital course: Onset of Labor With Vaginal Delivery      33 y.o. yo [id number] at [redacted]w[redacted]d was admitted in Active Labor on 10/18/2023. Labor course was without complication. She received an epidural and AROM and rapidly progressed to 10/100/+1 and pushed , delivering viable female infant over intact perineum. Apgars 9/9. Membrane Rupture Time/Date: 2:42 AM,10/19/2023  Delivery Method:Vaginal, Spontaneous Operative Delivery:N/A Episiotomy: None Lacerations:  None Patient had an uncomplicated postpartum course.  She is  ambulating, tolerating a regular diet, passing flatus, and urinating well. Patient is discharged home in stable condition on 10/20/23.  Newborn Data: Birth date:10/19/2023 Birth time:4:18 AM Gender:Female Living status:Living Apgars:9 ,9  Weight:3750 g  Magnesium  Sulfate received: No BMZ received: No Rhophylac:No MMR:No Varivax vaccine given: was not indicated T-DaP:Given prenatally Flu: N/A  Transfusion:No  Physical exam  Vitals:   10/19/23 1620 10/19/23 1913 10/19/23 2303 10/20/23 0815  BP: 135/66 101/70 127/78 120/71  Pulse: 86 78 75 77  Resp: 16 18 18 20   Temp: 97.6 F (36.4 C) 97.7 F (36.5 C) (!) 97.5 F (36.4 C) 97.7 F (36.5 C)  TempSrc: Oral Oral Oral Oral  SpO2:  99% 99% 98%  Weight:      Height:       General: alert, cooperative, and no distress Lochia: appropriate Uterine Fundus: firm Perineum:minimal edema/intact DVT Evaluation: No evidence of DVT seen on physical exam. Negative Homan's sign. No cords or calf tenderness. No significant calf/ankle edema.  Labs: Lab Results  Component Value Date   WBC 9.0 10/20/2023   HGB 9.5 (L) 10/20/2023   HCT 32.2 (L) 10/20/2023   MCV 83.9 10/20/2023   PLT 277 10/20/2023      Latest Ref Rng & Units 10/18/2023   11:37 PM  CMP  Glucose 70 - 99 mg/dL 91   BUN 6 - 20 mg/dL 8   Creatinine 9.55 - 8.99 mg/dL 9.14   Sodium 864 - 854 mmol/L 135   Potassium 3.5 - 5.1 mmol/L  3.7   Chloride 98 - 111 mmol/L 104   CO2 22 - 32 mmol/L 23   Calcium  8.9 - 10.3 mg/dL 8.4   Total Protein 6.5 - 8.1 g/dL 6.9   Total Bilirubin 0.0 - 1.2 mg/dL 0.3   Alkaline Phos 38 - 126 U/L 194   AST 15 - 41 U/L 18   ALT 0 - 44 U/L 10    Edinburgh Score:    10/20/2023   11:21 AM  Edinburgh Postnatal Depression Scale Screening Tool  I have been able to laugh and see the funny side of things. 0  I have looked forward with enjoyment to things. 0  I have blamed myself unnecessarily when things went wrong. 3  I have been anxious or  worried for no good reason. 0  I have felt scared or panicky for no good reason. 0  Things have been getting on top of me. 0  I have been so unhappy that I have had difficulty sleeping. 0  I have felt sad or miserable. 0  I have been so unhappy that I have been crying. 0  The thought of harming myself has occurred to me. 0  Edinburgh Postnatal Depression Scale Total 3    Risk assessment for postpartum VTE and prophylactic treatment: Very high risk factors: None High risk factors: BMI 40-50 kg/m2 Moderate risk factors: None  Postpartum VTE prophylaxis with LMWH not indicated  After visit meds:  Allergies as of 10/20/2023       Reactions   Mixed Ragweed         Medication List     STOP taking these medications    clonazePAM  0.5 MG tablet Commonly known as: KLONOPIN        TAKE these medications    acetaminophen  325 MG tablet Commonly known as: Tylenol  Take 2 tablets (650 mg total) by mouth every 4 (four) hours as needed (for pain scale < 4).   benzocaine -Menthol  20-0.5 % Aero Commonly known as: DERMOPLAST Apply 1 Application topically as needed for irritation (perineal discomfort).   coconut oil Oil Apply 1 Application topically as needed.   cyclobenzaprine  10 MG tablet Commonly known as: FLEXERIL  Take 1 tablet (10 mg total) by mouth 3 (three) times daily as needed for muscle spasms.   dibucaine 1 % Oint Commonly known as: NUPERCAINAL Place 1 Application rectally as needed for hemorrhoids.   famotidine  20 MG tablet Commonly known as: PEPCID  Take 1 tablet (20 mg total) by mouth daily. Start taking on: October 21, 2023   hydrocortisone  25 MG suppository Commonly known as: ANUSOL -HC Place 25 mg rectally 2 (two) times daily as needed for hemorrhoids or anal itching.   ibuprofen  600 MG tablet Commonly known as: ADVIL  Take 1 tablet (600 mg total) by mouth every 6 (six) hours.   prenatal multivitamin Tabs tablet Take 1 tablet by mouth daily at 12 noon. Start  taking on: October 21, 2023   senna-docusate 8.6-50 MG tablet Commonly known as: Senokot-S Take 2 tablets by mouth daily. Start taking on: October 21, 2023   simethicone  80 MG chewable tablet Commonly known as: MYLICON Chew 1 tablet (80 mg total) by mouth as needed for flatulence.   witch hazel-glycerin  pad Commonly known as: TUCKS Apply 1 Application topically as needed for hemorrhoids.       Discharge home in stable condition Infant Feeding: Breast Infant Disposition:home with mother Discharge instruction: per After Visit Summary and Postpartum booklet. Activity: Advance as tolerated. Pelvic rest for 6 weeks.  Diet: routine diet Anticipated Birth Control: Mirena IUD Postpartum Appointment:6 weeks Additional Postpartum F/U: BP check 1 week Future Appointments:No future appointments. Follow up Visit:  Follow-up Information     Aisha Heller, CNM. Schedule an appointment as soon as possible for a visit in 6 week(s).   Specialty: Obstetrics Why: pp visit and IUD placement Contact information: [address] [place] [address] [phone]         Aisha Heller, CNM. Schedule an appointment as soon as possible for a visit in 2 week(s).   Specialty: Obstetrics Why: mood check Contact information: [address] [place] [address] [phone]                 Plan:  Jenna Mcgrath was discharged to home in good condition. Follow-up appointment as directed.    Signed:  Kaytlyn Din, CNM 10/20/2023 3:09 PM

## 2023-10-19 NOTE — H&P (Signed)
 OB History & Physical   History of Present Illness:  Chief Complaint:   HPI:  Jenna Mcgrath is a 33 y.o. [phone] female at [redacted]w[redacted]d dated by LMP.  She presents to L&D for uterine contractions that started around noon and got stronger around 4pm.   Pregnancy Issues: 1. Obesity 2. Closely spaced pregnancies 3. 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 Anemia 5. Restless leg syndrome 6. Umbilical hernia 7. Elevated 1hr GTT   Maternal Medical History:   Past Medical History:  Diagnosis Date   Encounter for initial prescription of Nexplanon  06/20/2017   Lot # [id number]  [id number]  Expiration Date Nov 29 2019     Encounter for supervision of normal pregnancy in second trimester 11/16/2021   Formatting of this note might be different from the original. 33 y.o. [id number] at  Patient's last menstrual period was 08/23/2021 (exact date). consistent with  ultrasound @ [redacted]w[redacted]d.Estimated Date of Delivery:05/30/22. Sex of baby and name:  Alani    Partner: Keven   Wants [name] to deliver Factors complicating this pregnancy History of placenta previa with 2 prior pregnancies (resolved by delivery    Medical history non-contributory     Past Surgical History:  Procedure Laterality Date   TONSILLECTOMY AND ADENOIDECTOMY     VULVA SURGERY      Allergies  Allergen Reactions   Mixed Ragweed     Prior to Admission medications   Medication Sig Start Date End Date Taking? Authorizing Provider  clonazePAM (KLONOPIN) 0.5 MG tablet Take 0.25 mg by mouth at bedtime as needed for anxiety.    [provider]  hydrocortisone (ANUSOL-HC) 25 MG suppository Place 25 mg rectally 2 (two) times daily as needed for hemorrhoids or anal itching.    [provider]   Prenatal care site: [facility]   Social History: She  reports that she has been smoking cigars. She has never used smokeless tobacco. She reports that she does not currently use alcohol. She reports that she does not use  drugs.  Family History: family history includes Cancer in her maternal grandmother; Colon cancer in her paternal grandfather; Diabetes in her father, maternal grandmother, mother, and paternal grandmother; Hypertension in her father.   Review of Systems: A full review of systems was performed and negative except as noted in the HPI.     Physical Exam:  Vital Signs: BP (!) 150/82   Pulse 97   LMP 01/12/2023 Comment: [redacted] weeks Gestation General: no acute distress.  HEENT: normocephalic, atraumatic Heart: regular rate & rhythm.  No murmurs/rubs/gallops Lungs: clear to auscultation bilaterally, normal respiratory effort Abdomen: soft, gravid, non-tender;  EFW: 7.5lb Pelvic:   External: Normal external female genitalia  Cervix: Dilation: 6.5 / Effacement (%): 80 / Station: -2    Extremities: non-tender, symmetric, mild edema bilaterally.  DTRs: +2  Neurologic: Alert & oriented x 3.    Results for orders placed or performed during the hospital encounter of 10/18/23 (from the past 24 hours)  Protein / creatinine ratio, urine     Status: None   Collection Time: 10/18/23 11:01 PM  Result Value Ref Range   Creatinine, Urine 179 mg/dL   Total Protein, Urine 15 mg/dL   Protein Creatinine Ratio 0.08 0.00 - 0.15 mg/mg[Cre]  Comprehensive metabolic panel     Status: Abnormal   Collection Time: 10/18/23 11:37 PM  Result Value Ref Range   Sodium 135 135 - 145 mmol/L   Potassium 3.7 3.5 - 5.1 mmol/L   Chloride 104  98 - 111 mmol/L   CO2 23 22 - 32 mmol/L   Glucose, Bld 91 70 - 99 mg/dL   BUN 8 6 - 20 mg/dL   Creatinine, Ser 9.14 0.44 - 1.00 mg/dL   Calcium 8.4 (L) 8.9 - 10.3 mg/dL   Total Protein 6.9 6.5 - 8.1 g/dL   Albumin 2.6 (L) 3.5 - 5.0 g/dL   AST 18 15 - 41 U/L   ALT 10 0 - 44 U/L   Alkaline Phosphatase 194 (H) 38 - 126 U/L   Total Bilirubin 0.3 0.0 - 1.2 mg/dL   GFR, Estimated >39 >39 mL/min   Anion gap 8 5 - 15  CBC     Status: Abnormal   Collection Time: 10/18/23 11:37 PM   Result Value Ref Range   WBC 10.6 (H) 4.0 - 10.5 K/uL   RBC 4.16 3.87 - 5.11 MIL/uL   Hemoglobin 10.6 (L) 12.0 - 15.0 g/dL   HCT 65.2 (L) 63.9 - 53.9 %   MCV 83.4 80.0 - 100.0 fL   MCH 25.5 (L) 26.0 - 34.0 pg   MCHC 30.5 30.0 - 36.0 g/dL   RDW 82.6 (H) 88.4 - 84.4 %   Platelets 323 150 - 400 K/uL   nRBC 0.3 (H) 0.0 - 0.2 %    Pertinent Results:  Prenatal Labs: Blood type/Rh A pos  Antibody screen neg  Rubella Immune  Varicella Immune  RPR NR  HBsAg Neg  HIV NR  GC neg  Chlamydia neg  Genetic screening negative  1 hour GTT 173  3 hour GTT 81, 157, 123, 44   GBS Negative   FHT: 135bpm, moderate variability TOCO: contractions q2-3min, palpate moderate SVE:  Dilation: 6.5 / Effacement (%): 80 / Station: -2    Cephalic by leopolds  No results found.  Assessment:  Jenna Mcgrath is a 33 y.o. [phone] female at [redacted]w[redacted]d with active labor and elevated BP.   Plan:  1. Admit to Labor & Delivery; consents reviewed and obtained - Dr. Verdon notified of admission and plan of care  2. Fetal Well being  - Fetal Tracing: Category I tracing - Group B Streptococcus ppx indicated: n/a, GBS negative - Presentation: vertex confirmed by bedside US    3. Routine OB: - Prenatal labs reviewed, as above - Rh positive - CBC, T&S, RPR on admit - Clear fluids, saline lock  4. Monitoring of Labor -  Contractions q2-61min, external toco in place -  Pelvis proven to 3290g, adequate for trial of labor -  Plan for augmentation with pitocin  and/or AROM if indicated -  Plan for continuous fetal monitoring  -  Maternal pain control as desired; requesting regional anesthesia - Anticipate vaginal delivery  5. Post Partum Planning: - Infant feeding: breastfeeding - Contraception: Mirena IUD - Tdap: received AP - Flu: declined  6. cHTN: - No medications - BP tonight 140s-150s/80s-90s - PIH labs WNL - Notify CNM of severe-range BP >160/110  Edsel Charlies Blush,  CNM 10/19/23 12:17 AM

## 2023-10-20 LAB — CBC
HCT: 32.2 % — ABNORMAL LOW (ref 36.0–46.0)
Hemoglobin: 9.5 g/dL — ABNORMAL LOW (ref 12.0–15.0)
MCH: 24.7 pg — ABNORMAL LOW (ref 26.0–34.0)
MCHC: 29.5 g/dL — ABNORMAL LOW (ref 30.0–36.0)
MCV: 83.9 fL (ref 80.0–100.0)
Platelets: 277 K/uL (ref 150–400)
RBC: 3.84 MIL/uL — ABNORMAL LOW (ref 3.87–5.11)
RDW: 17.3 % — ABNORMAL HIGH (ref 11.5–15.5)
WBC: 9 K/uL (ref 4.0–10.5)
nRBC: 0.2 % (ref 0.0–0.2)

## 2023-10-20 MED ORDER — COCONUT OIL OIL: 1.0000 | TOPICAL_OIL | Status: AC | PRN

## 2023-10-20 MED ORDER — CYCLOBENZAPRINE HCL 10 MG PO TABS: 10.0000 mg | ORAL_TABLET | Freq: Three times a day (TID) | ORAL | 1 refills | Status: AC | PRN

## 2023-10-20 MED ORDER — PRENATAL MULTIVITAMIN CH: 1.0000 | ORAL_TABLET | Freq: Every day | ORAL | Status: AC

## 2023-10-20 MED ORDER — BENZOCAINE-MENTHOL 20-0.5 % EX AERO: 1.0000 | INHALATION_SPRAY | CUTANEOUS | Status: AC | PRN

## 2023-10-20 MED ORDER — DIBUCAINE (PERIANAL) 1 % EX OINT: 1.0000 | TOPICAL_OINTMENT | CUTANEOUS | Status: AC | PRN

## 2023-10-20 MED ORDER — WITCH HAZEL-GLYCERIN EX PADS: 1.0000 | MEDICATED_PAD | CUTANEOUS | Status: AC | PRN

## 2023-10-20 MED ORDER — SENNOSIDES-DOCUSATE SODIUM 8.6-50 MG PO TABS: 2.0000 | ORAL_TABLET | Freq: Every day | ORAL | Status: AC

## 2023-10-20 MED ORDER — ACETAMINOPHEN 325 MG PO TABS: 650.0000 mg | ORAL_TABLET | ORAL | Status: AC | PRN

## 2023-10-20 MED ORDER — FAMOTIDINE 20 MG PO TABS: 20.0000 mg | ORAL_TABLET | Freq: Every day | ORAL | 0 refills | Status: AC

## 2023-10-20 MED ORDER — SIMETHICONE 80 MG PO CHEW: 80.0000 mg | CHEWABLE_TABLET | ORAL | Status: AC | PRN

## 2023-10-20 MED ORDER — IBUPROFEN 600 MG PO TABS: 600.0000 mg | ORAL_TABLET | Freq: Four times a day (QID) | ORAL | 0 refills | Status: AC

## 2023-10-20 NOTE — Lactation Note (Signed)
 This note was copied from a baby's chart. Lactation Consultation Note  Patient Name: Jenna Mcgrath Date: 10/20/2023 Age:32 hours Reason for consult: Follow-up assessment;Term   Maternal Data This mom's 4th baby, SVD. Mom with history of obesity and anemia. On follow-up today mom reports baby has some feedings lasting 60-90 minutes.  Has patient been taught Hand Expression?: Yes Does the patient have breastfeeding experience prior to this delivery?: Yes How long did the patient breastfeed?: Mom breastfed her previous children ranging from 5 months to 8 months  Feeding Mother's Current Feeding Choice: Breast Milk Reviewed with mom nutritive verses non-nutritive sucking. Mom reports she can tell the difference between when the baby is eating verses pacifying. Discussed a complete feeding at the breast is 20-40 minutes of active sucking with swallows.Reviewed techniques to keep baby actively feeding at the breast.   LATCH Score Latch: Grasps breast easily, tongue down, lips flanged, rhythmical sucking. (Baby observed at the end of a feeding with a shallow latch. Mom reports baby had been latched deeply and had pushed back with her hand and became shallow. Mom understands how to correct latch.)  Audible Swallowing: Spontaneous and intermittent  Type of Nipple: Everted at rest and after stimulation  Comfort (Breast/Nipple): Soft / non-tender  Hold (Positioning): No assistance needed to correctly position infant at breast.  LATCH Score: 10   Interventions Interventions: Breast feeding basics reviewed;Assisted with latch;Education  Discharge Discharge Education: Engorgement and breast care;Warning signs for feeding baby;Outpatient recommendation Pump: Personal;DEBP  Consult Status Consult Status: Complete  Updated provided to care nurse.  [name] 10/20/2023, 12:51 PM

## 2023-10-20 NOTE — Discharge Instructions (Signed)

## 2023-10-20 NOTE — Anesthesia Postprocedure Evaluation (Signed)
 Anesthesia Post Note  Patient: Jenna Mcgrath  Procedure(s) Performed: AN AD HOC LABOR EPIDURAL  Patient location during evaluation: Mother Baby Anesthesia Type: Epidural Level of consciousness: awake and alert Pain management: pain level controlled Vital Signs Assessment: post-procedure vital signs reviewed and stable Respiratory status: spontaneous breathing, nonlabored ventilation and respiratory function stable Cardiovascular status: stable Postop Assessment: no headache, no backache, patient able to bend at knees and able to ambulate Anesthetic complications: no   No notable events documented.   Last Vitals:  Vitals:   10/19/23 2303 10/20/23 0815  BP: 127/78 120/71  Pulse: 75 77  Resp: 18 20  Temp: (!) 36.4 C 36.5 C  SpO2: 99% 98%    Last Pain:  Vitals:   10/20/23 0920  TempSrc:   PainSc: 6                  Fairy POUR [name]

## 2024-04-25 ENCOUNTER — Other Ambulatory Visit: Payer: Self-pay | Admitting: Surgery

## 2024-04-25 DIAGNOSIS — R2232 Localized swelling, mass and lump, left upper limb: Secondary | ICD-10-CM

## 2024-05-14 ENCOUNTER — Other Ambulatory Visit

## 2024-05-30 ENCOUNTER — Other Ambulatory Visit
